# Patient Record
Sex: Male | Born: 1955 | Race: White | Hispanic: No | Marital: Married | Smoking: Never smoker
Health system: Southern US, Community
[De-identification: ages and names within clinical notes are randomized; demographics above are authoritative.]

## PROBLEM LIST (undated history)

## (undated) DIAGNOSIS — D72819 Decreased white blood cell count, unspecified: Secondary | ICD-10-CM

## (undated) DIAGNOSIS — J45909 Unspecified asthma, uncomplicated: Secondary | ICD-10-CM

## (undated) DIAGNOSIS — L719 Rosacea, unspecified: Secondary | ICD-10-CM

## (undated) DIAGNOSIS — G8929 Other chronic pain: Secondary | ICD-10-CM

## (undated) DIAGNOSIS — M542 Cervicalgia: Secondary | ICD-10-CM

## (undated) DIAGNOSIS — G43909 Migraine, unspecified, not intractable, without status migrainosus: Secondary | ICD-10-CM

## (undated) DIAGNOSIS — K219 Gastro-esophageal reflux disease without esophagitis: Secondary | ICD-10-CM

## (undated) DIAGNOSIS — R634 Abnormal weight loss: Secondary | ICD-10-CM

## (undated) HISTORY — DX: Abnormal weight loss: R63.4

## (undated) HISTORY — DX: Gastro-esophageal reflux disease without esophagitis: K21.9

## (undated) HISTORY — DX: Rosacea, unspecified: L71.9

## (undated) HISTORY — DX: Other chronic pain: G89.29

## (undated) HISTORY — DX: Migraine, unspecified, not intractable, without status migrainosus: G43.909

## (undated) HISTORY — DX: Cervicalgia: M54.2

## (undated) HISTORY — DX: Unspecified asthma, uncomplicated: J45.909

## (undated) HISTORY — DX: Decreased white blood cell count, unspecified: D72.819

---

## 2009-05-05 ENCOUNTER — Encounter: Admission: RE | Admit: 2009-05-05 | Discharge: 2009-05-05 | Payer: Self-pay | Admitting: Family Medicine

## 2009-11-18 ENCOUNTER — Encounter: Admission: RE | Admit: 2009-11-18 | Discharge: 2009-11-18 | Payer: Self-pay | Admitting: Family Medicine

## 2010-10-01 ENCOUNTER — Encounter: Admission: RE | Admit: 2010-10-01 | Discharge: 2010-10-01 | Payer: Self-pay | Admitting: Family Medicine

## 2011-03-26 ENCOUNTER — Encounter: Payer: Self-pay | Admitting: Sports Medicine

## 2011-03-26 ENCOUNTER — Ambulatory Visit (INDEPENDENT_AMBULATORY_CARE_PROVIDER_SITE_OTHER): Payer: Managed Care, Other (non HMO) | Admitting: Sports Medicine

## 2011-03-26 VITALS — BP 111/74 | HR 70 | Ht 69.0 in | Wt 161.0 lb

## 2011-03-26 DIAGNOSIS — IMO0002 Reserved for concepts with insufficient information to code with codable children: Secondary | ICD-10-CM

## 2011-03-26 DIAGNOSIS — M25562 Pain in left knee: Secondary | ICD-10-CM

## 2011-03-26 DIAGNOSIS — S83249A Other tear of medial meniscus, current injury, unspecified knee, initial encounter: Secondary | ICD-10-CM

## 2011-03-26 DIAGNOSIS — M25569 Pain in unspecified knee: Secondary | ICD-10-CM

## 2011-03-26 NOTE — Assessment & Plan Note (Signed)
He should use ice and symptomatic medications as needed. A series of rehabilitation exercises that do not allow him to much knee flexion. Start training on a bicycle to improve quad strength on the left. Further knee extensions and a ball between his knees to get the VMO active  Recheck in 6-8 weeks. Avoid water skiing during this time.

## 2011-03-26 NOTE — Progress Notes (Signed)
  Subjective:    Patient ID: Connor Lee, male    DOB: Nov 19, 1956, 55 y.o.   MRN: [id number]  HPI  Pt presents to clinic for eval of lt knee pain that has been worsening x 1 year.  Pain initially started in his late 37s, and was evaluated at that time, and had improved for many years.  Has started becoming more active in Feb since retiring.  Walking on treadmill 4-5 per week.  Knee feels tight with walking initially, then feels better as the walk progresses.  Also feels some medial knee pain with walking.  When walking downhill feels like knee could give away, does not have locking.  Takes advil occasionally which is helpful.    Review of Systems     Objective:   Physical Exam    Atrophy of L VMO L knee lacks 3 deg of full extension with straight leg raise R knee full extension, and VMO/quad strength is good  L full flexion with clicking on medial joint line, full lateral flexion Medial rotation w flexion causes pain at joint line Lockman neg MCL/LCL stable Abduction strength good on lt Full extension in lying position of lt knee when relaxed   MSK ultrasound There are a couple small spurs at the superior patella at the attachment of the quadriceps tendon. There is no suprapatellar pouch effusion and no tears in the tendon itself. Patellar tendon is intact with the exception of the lateral margin which has a small hypoechoic area seen on both the long and transverse views. There was no abnormal Doppler activity over this area. Lateral meniscus is normal. Medial meniscus shows a hypoechoic area with a separated fragment and some calcification in the main meniscal body along the medial joint line at the point of maximal tenderness.   Assessment & Plan:

## 2011-03-26 NOTE — Assessment & Plan Note (Signed)
He is a compression sleeve for the left knee which did make him feel a bit more stable when he is going down hills or going to need to much. Avoid any deep collection or anything that causes more than mild pain of the left medial knee.  If he is not successful with conservative healing we will refer him for further evaluation.

## 2011-03-26 NOTE — Progress Notes (Deleted)
  Subjective:    Patient ID: Connor Lee, male    DOB: [birth year], 55 y.o.   MRN: [id number]  HPI    Review of Systems     Objective:   Physical Exam        Assessment & Plan:

## 2011-04-15 ENCOUNTER — Ambulatory Visit: Payer: Self-pay | Admitting: Sports Medicine

## 2011-04-29 ENCOUNTER — Encounter: Payer: Self-pay | Admitting: Sports Medicine

## 2011-04-29 ENCOUNTER — Ambulatory Visit (INDEPENDENT_AMBULATORY_CARE_PROVIDER_SITE_OTHER): Payer: Managed Care, Other (non HMO) | Admitting: Sports Medicine

## 2011-04-29 VITALS — BP 110/74 | HR 75 | Ht 69.0 in | Wt 160.0 lb

## 2011-04-29 DIAGNOSIS — M25562 Pain in left knee: Secondary | ICD-10-CM

## 2011-04-29 DIAGNOSIS — M25569 Pain in unspecified knee: Secondary | ICD-10-CM

## 2011-04-29 DIAGNOSIS — S83249A Other tear of medial meniscus, current injury, unspecified knee, initial encounter: Secondary | ICD-10-CM

## 2011-04-29 DIAGNOSIS — IMO0002 Reserved for concepts with insufficient information to code with codable children: Secondary | ICD-10-CM

## 2011-04-29 NOTE — Assessment & Plan Note (Signed)
Much improved with concservative tx.  Recommended continued HEP, wear knee sleeve for another month.  Advised may try skiing in 1 month.  Follow-up prn

## 2011-04-29 NOTE — Assessment & Plan Note (Signed)
Pain has pretty much resolved so he can use when necessary medications. I would continue to use the knee compression sleeve on his left knee for activity.

## 2011-04-29 NOTE — Progress Notes (Signed)
  Subjective:    Patient ID: Les Pou, male    DOB: Jun 13, 1956, 55 y.o.   MRN: [id number]  HPI here for 6 week recheck of left knee pain due to meniscal tear  80% improved No longer giving out during walks Using body helix Eager to get back to skiing No swelling, pain, need for medications.   Review of Systems     Objective:   Physical Exam Left knee: Improvement in left VMO atrophy, good quad strength Able to fully extend knee, fully flex Minimal pain on palpation of medial joint line No clicking Lachmann negative Normal bounce test Normal Thessaly test  Objective:          Assessment & Plan:

## 2011-05-04 ENCOUNTER — Ambulatory Visit: Payer: Managed Care, Other (non HMO) | Admitting: Family Medicine

## 2011-08-18 ENCOUNTER — Other Ambulatory Visit: Payer: Self-pay | Admitting: Family Medicine

## 2011-08-18 ENCOUNTER — Ambulatory Visit
Admission: RE | Admit: 2011-08-18 | Discharge: 2011-08-18 | Disposition: A | Discharge status: Home / Self Care | Payer: Managed Care, Other (non HMO) | Source: Ambulatory Visit | Attending: Family Medicine | Admitting: Family Medicine

## 2011-08-18 DIAGNOSIS — R634 Abnormal weight loss: Secondary | ICD-10-CM

## 2011-08-18 MED ORDER — IOHEXOL 300 MG/ML  SOLN
100.0000 mL | Freq: Once | INTRAMUSCULAR | Status: AC | PRN
  Administered 2011-08-18: 100 mL via INTRAVENOUS

## 2011-08-30 ENCOUNTER — Ambulatory Visit (HOSPITAL_COMMUNITY)
Admission: RE | Admit: 2011-08-30 | Discharge: 2011-08-30 | Disposition: A | Discharge status: Home / Self Care | Payer: Managed Care, Other (non HMO) | Source: Ambulatory Visit | Attending: Urology | Admitting: Urology

## 2011-08-30 DIAGNOSIS — Z79899 Other long term (current) drug therapy: Secondary | ICD-10-CM | POA: Insufficient documentation

## 2011-08-30 DIAGNOSIS — J45909 Unspecified asthma, uncomplicated: Secondary | ICD-10-CM | POA: Insufficient documentation

## 2011-08-30 DIAGNOSIS — N201 Calculus of ureter: Secondary | ICD-10-CM | POA: Insufficient documentation

## 2011-08-30 DIAGNOSIS — E7800 Pure hypercholesterolemia, unspecified: Secondary | ICD-10-CM | POA: Insufficient documentation

## 2011-08-30 DIAGNOSIS — K219 Gastro-esophageal reflux disease without esophagitis: Secondary | ICD-10-CM | POA: Insufficient documentation

## 2011-08-30 DIAGNOSIS — N1330 Unspecified hydronephrosis: Secondary | ICD-10-CM | POA: Insufficient documentation

## 2011-08-30 DIAGNOSIS — G43909 Migraine, unspecified, not intractable, without status migrainosus: Secondary | ICD-10-CM | POA: Insufficient documentation

## 2011-08-30 DIAGNOSIS — IMO0002 Reserved for concepts with insufficient information to code with codable children: Secondary | ICD-10-CM | POA: Insufficient documentation

## 2011-11-15 ENCOUNTER — Encounter (HOSPITAL_BASED_OUTPATIENT_CLINIC_OR_DEPARTMENT_OTHER): Admission: RE | Payer: Self-pay | Source: Ambulatory Visit

## 2011-11-15 ENCOUNTER — Ambulatory Visit (HOSPITAL_BASED_OUTPATIENT_CLINIC_OR_DEPARTMENT_OTHER): Admission: RE | Admit: 2011-11-15 | Payer: Managed Care, Other (non HMO) | Source: Ambulatory Visit | Admitting: Urology

## 2011-11-15 SURGERY — CYSTOSCOPY, WITH RETROGRADE PYELOGRAM AND URETERAL STENT INSERTION
Anesthesia: General | Laterality: Left

## 2012-12-26 ENCOUNTER — Ambulatory Visit (INDEPENDENT_AMBULATORY_CARE_PROVIDER_SITE_OTHER): Payer: BC Managed Care – PPO | Admitting: Sports Medicine

## 2012-12-26 ENCOUNTER — Encounter: Payer: Self-pay | Admitting: Sports Medicine

## 2012-12-26 VITALS — BP 118/74 | HR 75 | Ht 69.0 in | Wt 165.0 lb

## 2012-12-26 DIAGNOSIS — S83249A Other tear of medial meniscus, current injury, unspecified knee, initial encounter: Secondary | ICD-10-CM

## 2012-12-26 DIAGNOSIS — M533 Sacrococcygeal disorders, not elsewhere classified: Secondary | ICD-10-CM

## 2012-12-26 DIAGNOSIS — IMO0002 Reserved for concepts with insufficient information to code with codable children: Secondary | ICD-10-CM

## 2012-12-26 NOTE — Assessment & Plan Note (Signed)
I think it foot pain may actually relate to some radiation from the low back area  We will give him a home exercise program as noted in patient instructions  He will work on the areas of muscular tenderness and will return to see [place] as needed

## 2012-12-26 NOTE — Progress Notes (Signed)
  Subjective:    Patient ID: Connor Lee, male    DOB: 09-Jan-1956, 57 y.o.   MRN: [id number]  HPI  Pt presents to clinic for f/u of lt knee pain, left forefoot pain. Lt medial knee is doing well, only has occasional clicking.  Continues to do home exercises for knee. Walks on treadmill daily.  Has noticed cramping in lt forefoot with walking. Does not cause limping.  Loosens up as walk progresses.  Lower back stiffness during and after exercise. Also notices rt side lower back discomfort at times with sitting.  Review of Systems     Objective:   Physical Exam   NAD  No pain with toe, heel, or lateral foot walking  Moderately high arch Broad forefoot Small bunionettes No tenderness over metatarsals  Lt forefoot- not ttp  Leg lengths equal  Straight leg raise neg bilat, tighter on rt FABER normal bilat SI joints move freely  30 degree back extension with no problem SI joint nodule on rt He is somewhat tight to pretzel type stretch  LT Knee: Normal to inspection with no erythema or effusion or obvious bony abnormalities. Palpation normal with no warmth or joint line tenderness or patellar tenderness or condyle tenderness. ROM normal in flexion and extension and lower leg rotation. Ligaments with solid consistent endpoints including ACL, PCL, LCL, MCL. Negative Mcmurray's and provocative meniscal tests. Non painful patellar compression. Patellar and quadriceps tendons unremarkable. Hamstring and quadriceps strength is normal.           Assessment & Plan:

## 2012-12-26 NOTE — Patient Instructions (Addendum)
Do cobra stretch - 3 times for 5 seconds  Knee to opposite shoulder - 5 times for 5 seconds - each side  Both knees to chest - roll back and forth- 3 times for 5 seconds each  Superman stretch 3 times for 5 seconds each  Standing hip rotations  For forearm- Do bicep curls and wrist rolls   Please follow up as needed  Thank you for seeing [place] today!

## 2012-12-26 NOTE — Assessment & Plan Note (Signed)
I think this has healed and he can continue any exercises that do not create knee pain  Keep up some of his home exercise program 2-3 times weekly  Sports and soles look good and I think he should continue using those

## 2013-04-17 ENCOUNTER — Encounter: Payer: Self-pay | Admitting: Sports Medicine

## 2013-04-17 ENCOUNTER — Ambulatory Visit (INDEPENDENT_AMBULATORY_CARE_PROVIDER_SITE_OTHER): Payer: BC Managed Care – PPO | Admitting: Sports Medicine

## 2013-04-17 VITALS — BP 122/78 | HR 74 | Ht 69.0 in | Wt 165.0 lb

## 2013-04-17 DIAGNOSIS — M533 Sacrococcygeal disorders, not elsewhere classified: Secondary | ICD-10-CM

## 2013-04-17 NOTE — Progress Notes (Signed)
  Subjective:    Patient ID: Connor Lee, male    DOB: [birth year], 57 y.o.   MRN: [id number]  HPI  1. Low back pain. Has right side low back pain present for one year. 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 The prescribed home exercises seem to make pain worse initially. He also notes about one hour of stiffness in the mornings. Cyst feels essentially unchanged. He previously played golf many years, but recently is doing walking and likes to water ski.   He denies any recent falls, leg pain, weight loss, chills, sweats, numbness, weakness.   Review of Systems See HPI otherwise negative.  reports that he has never smoked. He has never used smokeless tobacco.     Objective:   Physical Exam  Constitutional: He appears well-developed and well-nourished. No distress.  Musculoskeletal:  Bilateral hip ROM intact, 45 deg int rotation, 35 deg ext rotation. TTP at right SI joint. Cyst is palpable at right SI~ 1.5 cm, mobile, smooth. AT left SI a smaller about 0.5 cm non-tender nodule palpable.  Negative straight leg testing and cross leg testing.   Lumbar ROM intact.    Skin: He is not diaphoretic.   MSK [place]: right SI joint has overlying iso-oechoic nodule 1.4 x 1.6 cm in diameter. This appears to be encased in a split of fascia.      Assessment & Plan:

## 2013-04-17 NOTE — Assessment & Plan Note (Addendum)
Continues with pain and stiffness, failed conservative management.   Written consent obtained and risk/benefits discussed, skin sterilized. The nodule is injected with steroid 0.4 cc kenolog with 0.4 cc lidocaine using a TB needle. This is done under direct [place] visualization  Discussed with patient to continue activities as tolerated and follow up prn. This may require more than 1 injection to shrink scar tissue.

## 2013-04-17 NOTE — Patient Instructions (Signed)
Follow up prn. Continue activities as tolerated.

## 2013-05-29 ENCOUNTER — Ambulatory Visit (INDEPENDENT_AMBULATORY_CARE_PROVIDER_SITE_OTHER): Payer: BC Managed Care – PPO | Admitting: Sports Medicine

## 2013-05-29 VITALS — BP 110/70 | Ht 69.0 in | Wt 165.0 lb

## 2013-05-29 DIAGNOSIS — M533 Sacrococcygeal disorders, not elsewhere classified: Secondary | ICD-10-CM

## 2013-05-29 DIAGNOSIS — R229 Localized swelling, mass and lump, unspecified: Secondary | ICD-10-CM

## 2013-05-29 NOTE — Assessment & Plan Note (Signed)
Excellent improvement in sxs and full activity is OK  Keep up stretches and exercises

## 2013-05-29 NOTE — Progress Notes (Signed)
[facility] [address] [place], [place] [address] Phone: [phone] Fax: [phone]   Patient Name: Connor Lee Date of Birth: 01-Dec-1955 Medical Record Number: [id number] Gender: male Date of Encounter: 05/29/2013  History of Present Illness:  Connor Lee is a 57 y.o. very pleasant male patient who presents with the following:  57 y/o CM with PMH of SIJ dysfunction returning to clinic for follow up. Has had better ROM and pain from Rt SIJ since receiving steroid injection into nodule. Still having some pain, especially with rotation and while playing golf. Overall, feeling better today. No recent injuries. Still using exercises. Denies fevers, chills, nausea, myalgias, arthralgias, rash.  Patient Active Problem List   Diagnosis Date Noted  . 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 Left knee pain 03/26/2011  . Tear of medial meniscus of knee 03/26/2011   No past medical history on file. No past surgical history on file. History  Substance Use Topics  . Smoking status: Never Smoker   . Smokeless tobacco: Never Used  . Alcohol Use: Not on file   No family history on file. No Known Allergies  Medication list has been reviewed and updated.  Prior to Admission medications   Medication Sig Start Date End Date Taking? Authorizing Provider  Albuterol (PROVENTIL IN) Inhale into the lungs as needed.      Historical Provider, MD  ciclesonide (ALVESCO) 160 MCG/ACT inhaler Inhale 1-2 puffs into the lungs 2 (two) times daily.      Historical Provider, MD  doxycycline (VIBRAMYCIN) 100 MG capsule Take 100 mg by mouth daily before supper.      Historical Provider, MD  lansoprazole (PREVACID) 30 MG capsule Take 15 mg by mouth daily.     Historical Provider, MD  Loratadine (CLARITIN PO) Take by mouth every morning.      Historical Provider, MD  metroNIDAZOLE (METROGEL) 0.75 % gel Apply 1 application topically 2 (two) times daily.       Historical Provider, MD  montelukast (SINGULAIR) 10 MG tablet Take 10 mg by mouth every morning.      Historical Provider, MD  penciclovir (DENAVIR) 1 % cream Apply 1 application topically as needed.      Historical Provider, MD  Vitamin D, Ergocalciferol, (DRISDOL) 50000 UNITS CAPS Take 50,000 Units by mouth every 7 (seven) days.      Historical Provider, MD    Review of Systems:  Denies any fever, chills or systemic sxs;  No generalized OA sxs  Physical Examination: Filed Vitals:   05/29/13 0859  BP: 110/70   Filed Vitals:   05/29/13 0859  Height: 5\' 9"  (1.753 m)  Weight: 165 lb (74.844 kg)   Body mass index is 24.36 kg/(m^2).  GEN: NAD. Pt is pleasant and cooperative with exam. MSK: FROM in flexion/extension, rotation and side bending. +Pain with Rt side loading in extension. FABER neg, FADIR neg. No pain in rocking SIJs. Pretzel stretch neg. Straight leg neg. Skin: soft, rubbery, easily movable nodule palpated superficial to Rt SIJ. No warmth, erythema, edema, fluctuance noted.   MSK [place] Nodule is visualized in same planes Compared to last [place] nodule was 1.6 x 1.4 cms Today nodule is 1.34 x0.28 cms with no fluid noted around soft tissue  Assessment and Plan: Sacroiliac joint dysfunction  Subcutaneous nodule  1. SIJ dysfunction - Most likely 2/2 nodule in area of SIJ. Continue with ice, NSAIDs and exercises.   2. Nodule - u/s performed in office showed about  75% decrease in size today s/p steroid injection. Will continue to monitor today. Consider steroid injection agree after 3 months has past since 1st injection.  Enid Baas, MD

## 2013-05-29 NOTE — Assessment & Plan Note (Signed)
Consider repeat CSI if this becomes painful again

## 2013-06-05 ENCOUNTER — Ambulatory Visit: Payer: BC Managed Care – PPO | Admitting: Sports Medicine

## 2013-07-25 ENCOUNTER — Ambulatory Visit (INDEPENDENT_AMBULATORY_CARE_PROVIDER_SITE_OTHER): Payer: BC Managed Care – PPO | Admitting: Sports Medicine

## 2013-07-25 VITALS — BP 108/60 | Ht 69.0 in | Wt 165.0 lb

## 2013-07-25 DIAGNOSIS — M533 Sacrococcygeal disorders, not elsewhere classified: Secondary | ICD-10-CM

## 2013-07-25 NOTE — Progress Notes (Signed)
  Subjective:    Patient ID: Connor Lee, male    DOB: May 17, 1956, 57 y.o.   MRN: [id number]  HPI Patient comes in today with returning right-sided low back pain. He is a well-established patient of Dr. Darrick Penna. He has most recently been treated for SI joint dysfunction. Back in May a painful nodule over the right SI joint was injected with cortisone. This resulted in good symptom relief and some shrinkage of the cyst on ultrasound. He was doing well until yesterday when he began to develop some returning stiffness and this morning had severe pain upon awakening. His pain is identical in nature to what he's experienced previously. He localizes it directly over the right SI joint. He has been doing a couple of SI joint exercises. He is not taking much in the way of over-the-counter anti-inflammatories. He denies any radiating pain down the leg. No numbness or tingling.  Interim medical history is unchanged from his last office visit    Review of Systems     Objective:   Physical Exam Well-developed, well-nourished. No acute distress. Awake alert and oriented x3  Lumbar spine: Good lumbar flexion and extension. There is tenderness to palpation over the right SI joint with a positive FABER. Over top of the SI joint just underneath the skin there is a freely mobile nodule. No erythema. It is fluctuant and uncomfortable to palpation. There is a small scar from a previous surgery in this area which is consistent with prior lipoma removal. Mild atrophy and hypopigmentation here as well. Negative straight leg raise. Walking without a limp  MSK ultrasound: Nodule was well visualized and measures 1.3 x 0.33 cm. This is almost identical to the measurements obtained in July on ultrasound.       Assessment & Plan:  1. 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 Patient tolerated this without  difficulty after risks and benefits were explained. He is given a comprehensive SI joint home exercise program and will return to the office in 4 weeks. If symptoms persist we may want to consider merits of an injection into the SI joint. We'll plan on repeating his ultrasound at his followup. Call with questions or concerns in the interim.

## 2013-08-22 ENCOUNTER — Encounter: Payer: Self-pay | Admitting: Sports Medicine

## 2013-08-22 ENCOUNTER — Ambulatory Visit (INDEPENDENT_AMBULATORY_CARE_PROVIDER_SITE_OTHER): Payer: BC Managed Care – PPO | Admitting: Sports Medicine

## 2013-08-22 VITALS — BP 119/80 | HR 89 | Ht 69.0 in | Wt 169.0 lb

## 2013-08-22 DIAGNOSIS — M533 Sacrococcygeal disorders, not elsewhere classified: Secondary | ICD-10-CM

## 2013-08-22 NOTE — Patient Instructions (Signed)
Thank you for coming in today  Continue home exercises and stretches  Followup as needed  Let us know if you have any return of concussion symptoms and we would be happy to help you
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
## 2014-10-28 ENCOUNTER — Encounter: Payer: Self-pay | Admitting: Pulmonary Disease

## 2014-10-29 ENCOUNTER — Other Ambulatory Visit: Payer: BC Managed Care – PPO

## 2014-10-29 ENCOUNTER — Encounter: Payer: Self-pay | Admitting: Pulmonary Disease

## 2014-10-29 ENCOUNTER — Ambulatory Visit (INDEPENDENT_AMBULATORY_CARE_PROVIDER_SITE_OTHER): Payer: BC Managed Care – PPO | Admitting: Pulmonary Disease

## 2014-10-29 ENCOUNTER — Ambulatory Visit (INDEPENDENT_AMBULATORY_CARE_PROVIDER_SITE_OTHER)
Admission: RE | Admit: 2014-10-29 | Discharge: 2014-10-29 | Disposition: A | Discharge status: Home / Self Care | Payer: BC Managed Care – PPO | Source: Ambulatory Visit | Attending: Pulmonary Disease | Admitting: Pulmonary Disease

## 2014-10-29 ENCOUNTER — Encounter (INDEPENDENT_AMBULATORY_CARE_PROVIDER_SITE_OTHER): Payer: Self-pay

## 2014-10-29 VITALS — BP 102/68 | HR 77 | Ht 69.0 in | Wt 176.0 lb

## 2014-10-29 DIAGNOSIS — J4540 Moderate persistent asthma, uncomplicated: Secondary | ICD-10-CM | POA: Insufficient documentation

## 2014-10-29 DIAGNOSIS — R0600 Dyspnea, unspecified: Secondary | ICD-10-CM | POA: Insufficient documentation

## 2014-10-29 DIAGNOSIS — R0602 Shortness of breath: Secondary | ICD-10-CM

## 2014-10-29 NOTE — Assessment & Plan Note (Signed)
Feel that the most likely cause of his shortness of breath is the asthma as explained above. If he does not have improvement with the increased dose of the inhaled corticosteroid then we could consider evaluation for cardiac problems with an echocardiogram and a stress test.

## 2014-10-29 NOTE — Progress Notes (Signed)
Subjective:    Patient ID: Connor Lee, male    DOB: 06/22/1956, 58 y.o.   MRN: [id number]  HPI Connor Lee was sent by his primary care physician Dr. Doristine CounterBurnett for evaluation of his asthma and allergies. He says that he travled this year at the end of September and he visited his family and he thinks that he caught bronchitis from someone on the trip.  He says that he started feeling poorly and he started using his rescue inhaler and by October 5 he started on prednisone 10mg  bid for a few days.  This didn't help so he took as much as 30mg  fo a few days.  This didn't help so he went to his primary care physician. At that point he was coughing up yellow to red mucus and he started taking Levaquin, finished a course 10/21.  He started to feel a little better.  He noticed however that his breathing wasn't back to normal and he had to cut back on the duration and pace of his exercise.  He tried using his albuterol just before exercise which hleped a little.  However he says that this has not brought him back to where hewated to be and he still has dyspnea with exercise.  During this time he has noticed that he continues to bring up some mucus in the mornings.  Otherwise he doesn't have problems with cough.    Some hoarseness lately.    Leading up until this he had not really had much problems in 2015.  He said that he works out regularly adn this has been good for the majority of this year.  He keeps prednisone at home for emergency situation and he says that the worst years he has to use this three times, but for the most part in the last 3 years he only had to use it once per year (or even less frequent than that).    He notes having dry needling donw for physician therapy recently.  They typically stick a needle in his neck with this.  He has never smoked.  He had ashtma as a kid including immunotherapy and missed a lot of school as a kid.  In recent years he has not had too much trouble (see above).   His sinus symptoms have been better lately.  He says that cigarette smokes, heavy air, nighttime, dogs, cats, dust, fumes typically make his symptoms worse.  He has not had to use his inhaler much lately aside from exercise.      Past Medical History  Diagnosis Date  . GERD (gastroesophageal reflux disease)   . Weight loss   . Asthma   . Chronic neck pain   . Leukopenia   . Migraine headache   . Rosacea      Family History  Problem Relation Age of Onset  . Asthma Daughter      History   Social History  . Marital Status: Married    Spouse Name: N/A    Number of Children: N/A  . Years of Education: N/A   Occupational History  . Not on file.   Social History Main Topics  . Smoking status: Never Smoker   . Smokeless tobacco: Never Used  . Alcohol Use: 0.0 oz/week    0 Not specified per week     Comment: social  . Drug Use: No  . Sexual Activity: Not on file   Other Topics Concern  . Not on file  Social History Narrative     Allergies  Allergen Reactions  . Penicillins      Outpatient Prescriptions Prior to Visit  Medication Sig Dispense Refill  . ciclesonide (ALVESCO) 160 MCG/ACT inhaler Inhale 2 puffs into the lungs 2 (two) times daily.     Marland Kitchen doxycycline (VIBRAMYCIN) 100 MG capsule Take 100 mg by mouth 2 (two) times daily.     . lansoprazole (PREVACID) 30 MG capsule Take 15 mg by mouth daily.     . Loratadine (CLARITIN PO) Take by mouth every morning.      . metroNIDAZOLE (METROGEL) 0.75 % gel Apply 1 application topically 2 (two) times daily as needed.     . montelukast (SINGULAIR) 10 MG tablet Take 10 mg by mouth every morning.      . penciclovir (DENAVIR) 1 % cream Apply 1 application topically as needed.      . Vitamin D, Ergocalciferol, (DRISDOL) 50000 UNITS CAPS Take 50,000 Units by mouth every 7 (seven) days.      . Albuterol (PROVENTIL IN) Inhale into the lungs as needed.      . cyclobenzaprine (FLEXERIL) 10 MG tablet Take 10 mg by mouth at bedtime  as needed for muscle spasms.     No facility-administered medications prior to visit.      Review of Systems  Constitutional: Positive for unexpected weight change. Negative for fever, chills, diaphoresis, activity change, appetite change and fatigue.  HENT: Negative for congestion, dental problem, ear discharge, ear pain, facial swelling, hearing loss, mouth sores, nosebleeds, postnasal drip, rhinorrhea, sinus pressure, sneezing, sore throat, tinnitus, trouble swallowing and voice change.   Eyes: Negative for photophobia, discharge, itching and visual disturbance.  Respiratory: Positive for cough and shortness of breath. Negative for apnea, choking, chest tightness, wheezing and stridor.   Cardiovascular: Negative for chest pain, palpitations and leg swelling.  Gastrointestinal: Negative for nausea, vomiting, abdominal pain, constipation, blood in stool and abdominal distention.  Genitourinary: Negative for dysuria, urgency, frequency, hematuria, flank pain, decreased urine volume and difficulty urinating.  Musculoskeletal: Negative for myalgias, back pain, joint swelling, arthralgias, gait problem, neck pain and neck stiffness.  Skin: Negative for color change, pallor and rash.  Neurological: Negative for dizziness, tremors, seizures, syncope, speech difficulty, weakness, light-headedness, numbness and headaches.  Hematological: Negative for adenopathy. Does not bruise/bleed easily.  Psychiatric/Behavioral: Negative for confusion, sleep disturbance and agitation. The patient is not nervous/anxious.        Objective:   Physical Exam Filed Vitals:   10/29/14 1535 10/29/14 1536  BP:  102/68  Pulse:  77  Height: 5\' 9"  (1.753 m)   Weight: 176 lb (79.833 kg)   SpO2:  98%   Today he ambulated 500 feet on room air and his O2 saturation remained above 95%.  Gen: well appearing, no acute distress HEENT: NCAT, PERRL, EOMi, OP clear, neck supple without masses PULM: very mild exp wheeze,  good air movement CV: RRR, no mgr, no JVD AB: BS+, soft, nontender, no hsm Ext: warm, no edema, no clubbing, no cyanosis Derm: no rash or skin breakdown Neuro: A&Ox4, CN II-XII intact, strength 5/5 in all 4 extremities  I have reviewed his primary care doctor's records      Assessment & Plan:   Moderate persistent asthma  I explained to Mr. Caniglia that it sounds like he had a nasty viral infection leading to bronchitis which is caused prolonged respiratory symptoms directly related to his lifelong asthma. Today he had very mild wheezing on physical  exam. There has been no changes in his environment recently to suggest that there is a new environmental cause. After severe viral infections folks can have persistent symptoms. Today we will check a chest x-ray as well as pulmonary function testing and some blood work to ensure that there is no other possible pulmonary or environmental cause which is causing his ongoing symptoms.  Plan: -Check full pulmonary function testing -Check serum IgE and eosinophil  -check chest x-ray -Increase controller steroid to 3 puffs twice a day for 2 weeks, then back off to baseline dose -Use albuterol every 6 hours for the next 1-2 weeks   Dyspnea Feel that the most likely cause of his shortness of breath is the asthma as explained above. If he does not have improvement with the increased dose of the inhaled corticosteroid then we could consider evaluation for cardiac problems with an echocardiogram and a stress test.    Updated Medication List Outpatient Encounter Prescriptions as of 10/29/2014  Medication Sig  . ciclesonide (ALVESCO) 160 MCG/ACT inhaler Inhale 2 puffs into the lungs 2 (two) times daily.   Marland Kitchen. doxycycline (VIBRAMYCIN) 100 MG capsule Take 100 mg by mouth 2 (two) times daily.   . lansoprazole (PREVACID) 30 MG capsule Take 15 mg by mouth daily.   . Loratadine (CLARITIN PO) Take by mouth every morning.    . metroNIDAZOLE (METROGEL) 0.75 %  gel Apply 1 application topically 2 (two) times daily as needed.   . montelukast (SINGULAIR) 10 MG tablet Take 10 mg by mouth every morning.    . penciclovir (DENAVIR) 1 % cream Apply 1 application topically as needed.    Marland Kitchen. PROAIR HFA 108 (90 BASE) MCG/ACT inhaler Inhale 2 puffs into the lungs as needed.  . Vitamin D, Ergocalciferol, (DRISDOL) 50000 UNITS CAPS Take 50,000 Units by mouth every 7 (seven) days.    . [DISCONTINUED] Albuterol (PROVENTIL IN) Inhale into the lungs as needed.    . [DISCONTINUED] cyclobenzaprine (FLEXERIL) 10 MG tablet Take 10 mg by mouth at bedtime as needed for muscle spasms.

## 2014-10-29 NOTE — Patient Instructions (Signed)
Take the alvesco 3 puffs twice a day for a few weeks Take albuterol 2 puffs every 6 hours regularly for a few weeks We will call you with the results of today's blood work and chest x-ray We will see you back in January 2016

## 2014-10-29 NOTE — Assessment & Plan Note (Signed)
I explained to Connor Lee that it sounds like he had a nasty viral infection leading to bronchitis which is caused prolonged respiratory symptoms directly related to his lifelong asthma. Today he had very mild wheezing on physical exam. There has been no changes in his environment recently to suggest that there is a new environmental cause. After severe viral infections folks can have persistent symptoms. Today we will check a chest x-ray as well as pulmonary function testing and some blood work to ensure that there is no other possible pulmonary or environmental cause which is causing his ongoing symptoms.  Plan: -Check full pulmonary function testing -Check serum IgE and eosinophil  -check chest x-ray -Increase controller steroid to 3 puffs twice a day for 2 weeks, then back off to baseline dose -Use albuterol every 6 hours for the next 1-2 weeks

## 2014-10-30 LAB — IGE: IgE (Immunoglobulin E), Serum: 9 kU/L (ref <115)

## 2014-10-30 NOTE — Progress Notes (Signed)
Quick Note:  Pt aware of results. ______ 

## 2014-10-31 NOTE — Progress Notes (Signed)
Quick Note:  Pt aware of results. ______ 

## 2014-11-26 ENCOUNTER — Telehealth: Payer: Self-pay | Admitting: Pulmonary Disease

## 2014-11-26 NOTE — Telephone Encounter (Signed)
Called and spoke with pt and he is aware of lab that will be printed out and mailed to his home address that has been verified.  Pt stated that he is feeling better.  He is aware that the cbcd was not drawn.  Nothing further is needed.

## 2014-12-05 ENCOUNTER — Encounter: Payer: Self-pay | Admitting: Pulmonary Disease

## 2014-12-05 ENCOUNTER — Ambulatory Visit (INDEPENDENT_AMBULATORY_CARE_PROVIDER_SITE_OTHER): Payer: BLUE CROSS/BLUE SHIELD | Admitting: Pulmonary Disease

## 2014-12-05 ENCOUNTER — Encounter (INDEPENDENT_AMBULATORY_CARE_PROVIDER_SITE_OTHER): Payer: Self-pay

## 2014-12-05 VITALS — BP 130/68 | HR 89 | Ht 69.0 in | Wt 172.0 lb

## 2014-12-05 DIAGNOSIS — J4540 Moderate persistent asthma, uncomplicated: Secondary | ICD-10-CM

## 2014-12-05 NOTE — Patient Instructions (Signed)
Keep taking the Alvesco at 1 puff twice a day, continue Singulair As we discussed, you could consider stopping for a few months in the future if you are doing well, with plans to restart before the fall We will see you back as needed

## 2014-12-05 NOTE — Assessment & Plan Note (Signed)
This has been a stable interval for Connor Lee with his moderate persistent asthma. He has been able to titrate his Alvesco dose back down to 2 puffs twice a day. I have advised that he decrease it to 1 puff twice a day and continue at that dose for the remainder of the year. If he has a year of stability then he may start to consider stopping the inhaled corticosteroid during seasons when his shortness of breath and wheezing are not a problem.  I have also advised that he continue to use albuterol on an as-needed basis before exercise to prevent exercise-induced bronchospasm.  He should continue using Singulair.  He should continue to get an annual flu shot.

## 2014-12-05 NOTE — Progress Notes (Signed)
   Subjective:    Patient ID: Connor Lee, male    DOB: [birth year], 59 y.o.   MRN: [id number]  Synopsis: Moderate persistent asthma for most of his life, referred and late 2015 after a mild exacerbation was treated as an outpatient.  HPI  Chief Complaint  Patient presents with  . Follow-up    pt states using albuterol before exercize helps with sob, chest tightness.  fatigue has resolved.     Connor Lee is doing better now.  He says that the fatigue is gone and his exercise tolerance is much better.  He tried using the labuterol before and that helps.  No chest tightness, no wheezing, no shortness of breath.    Past Medical History  Diagnosis Date  . GERD (gastroesophageal reflux disease)   . Weight loss   . Asthma   . Chronic neck pain   . Leukopenia   . Migraine headache   . Rosacea      Review of Systems     Objective:   Physical Exam  Filed Vitals:   12/05/14 1551  BP: 130/68  Pulse: 89  Height: 5\' 9"  (1.753 m)  Weight: 172 lb (78.019 kg)  SpO2: 98%  RA  Gen: well appearing, no acute distress HEENT: NCAT, EOMi, OP clear, PULM: CTA B CV: RRR, no mgr, no JVD AB: BS+, soft, nontender,  Ext: warm, no edema, no clubbing, no cyanosis Derm: no rash or skin breakdown Neuro: A&Ox4, MAEW        Assessment & Plan:   Moderate persistent asthma This has been a stable interval for Jhovanny with his moderate persistent asthma. He has been able to titrate his Alvesco dose back down to 2 puffs twice a day. I have advised that he decrease it to 1 puff twice a day and continue at that dose for the remainder of the year. If he has a year of stability then he may start to consider stopping the inhaled corticosteroid during seasons when his shortness of breath and wheezing are not a problem.  I have also advised that he continue to use albuterol on an as-needed basis before exercise to prevent exercise-induced bronchospasm.  He should continue using Singulair.  He should  continue to get an annual flu shot.    Updated Medication List Outpatient Encounter Prescriptions as of 12/05/2014  Medication Sig  . cholecalciferol (VITAMIN D) 400 UNITS TABS tablet Take 400 Units by mouth daily.  . ciclesonide (ALVESCO) 160 MCG/ACT inhaler Inhale 2 puffs into the lungs 2 (two) times daily.   Marland Kitchen. doxycycline (VIBRAMYCIN) 100 MG capsule Take 100 mg by mouth 2 (two) times daily.   . lansoprazole (PREVACID) 30 MG capsule Take 15 mg by mouth daily.   . Loratadine (CLARITIN PO) Take by mouth every morning.    . metroNIDAZOLE (METROGEL) 0.75 % gel Apply 1 application topically 2 (two) times daily as needed.   . montelukast (SINGULAIR) 10 MG tablet Take 10 mg by mouth every morning.    . penciclovir (DENAVIR) 1 % cream Apply 1 application topically as needed.    Marland Kitchen. PROAIR HFA 108 (90 BASE) MCG/ACT inhaler Inhale 2 puffs into the lungs as needed.  . [DISCONTINUED] Vitamin D, Ergocalciferol, (DRISDOL) 50000 UNITS CAPS Take 50,000 Units by mouth every 7 (seven) days.

## 2015-10-19 IMAGING — CR DG CHEST 2V
2 series · 2 of 2 positions shown · non-contrast
Comparison: None.

CLINICAL DATA: Dyspnea on exertion for 2 months. History of asthma.

EXAM:
CHEST  2 VIEW

[view not recorded (1 of 2)]
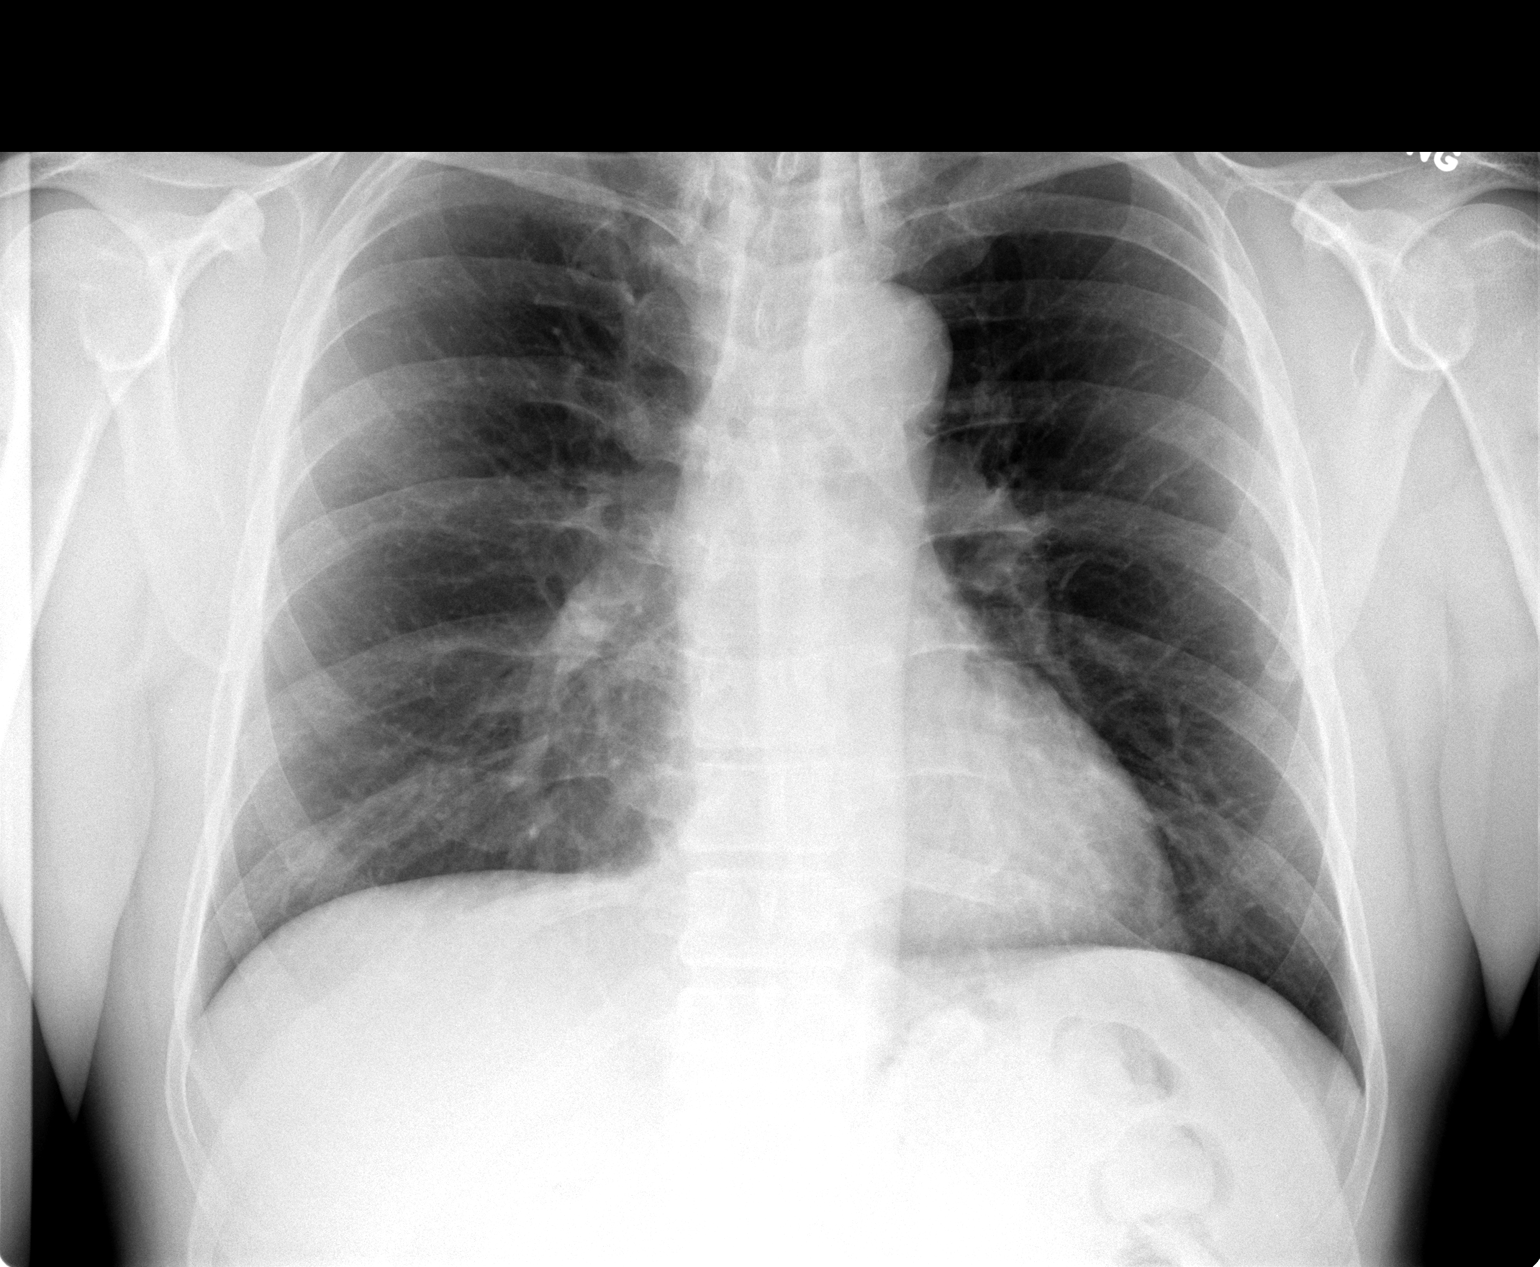

[view not recorded (2 of 2)]
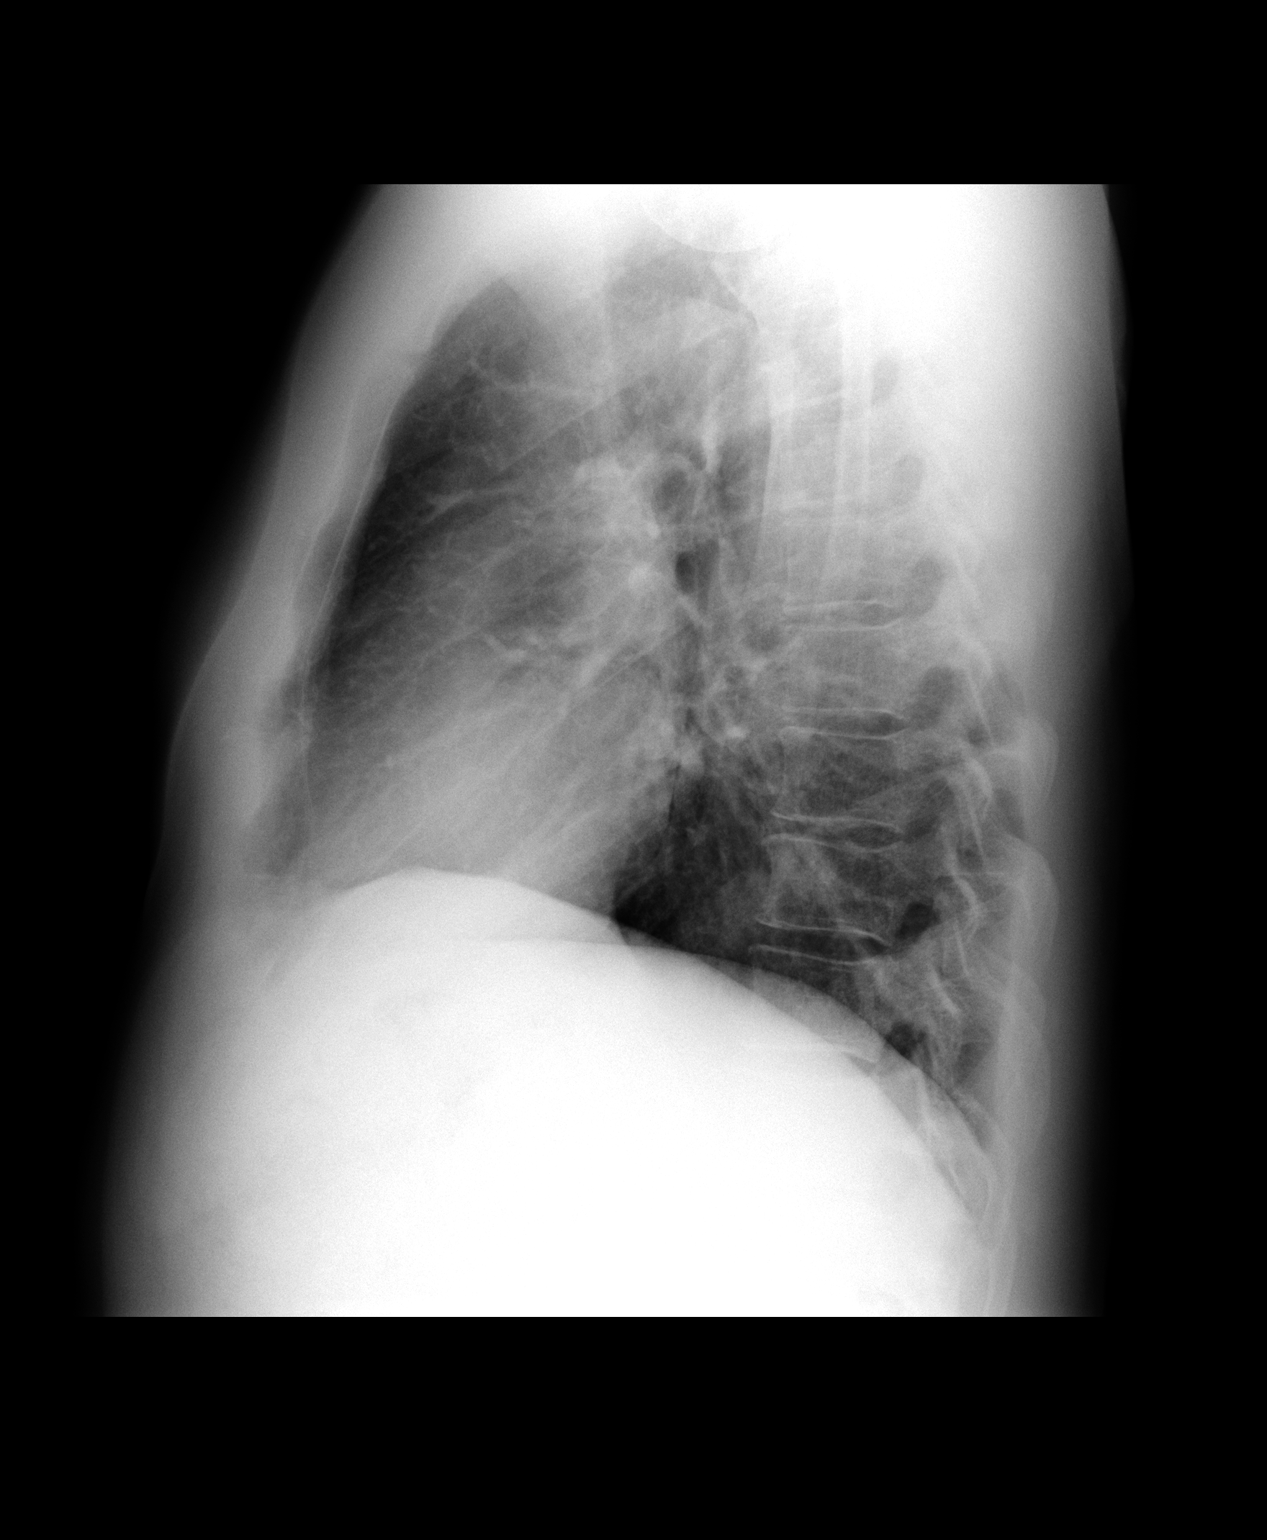

[2 of 2 positions shown; findings below may reference images not displayed]

FINDINGS: The cardiomediastinal silhouette is within normal limits. The lungs
are well inflated and clear. There is no evidence of pleural
effusion or pneumothorax. No acute osseous abnormality is
identified.
IMPRESSION: No active cardiopulmonary disease.

## 2016-01-06 ENCOUNTER — Ambulatory Visit (INDEPENDENT_AMBULATORY_CARE_PROVIDER_SITE_OTHER): Payer: BLUE CROSS/BLUE SHIELD | Admitting: Sports Medicine

## 2016-01-06 ENCOUNTER — Encounter: Payer: Self-pay | Admitting: Sports Medicine

## 2016-01-06 VITALS — BP 117/72 | HR 70 | Ht 69.0 in | Wt 168.0 lb

## 2016-01-06 DIAGNOSIS — M7742 Metatarsalgia, left foot: Secondary | ICD-10-CM | POA: Diagnosis not present

## 2016-01-06 DIAGNOSIS — M25562 Pain in left knee: Secondary | ICD-10-CM

## 2016-01-06 DIAGNOSIS — M25561 Pain in right knee: Secondary | ICD-10-CM | POA: Insufficient documentation

## 2016-01-06 NOTE — Progress Notes (Signed)
  Connor Lee - 60 y.o. male MRN [id number]  Date of birth: 1956/06/07  SUBJECTIVE:  Including CC & ROS.  Pt reports last spring he stopped his HEP/quad exercises that he had previously been given following his L medial meniscus tear. He stopped the exercises because he was several years out and not having any symptoms. Over the summer and fall he started noticing some pain on the bottom of his forefoot, radiating out to his 2nd and 3rd toes. The pain was most noticeable with walking and he generally walks 4-6 miles a day. He kept walking through the foot pain and then started noticing some soreness of his L knee and later his R knee only when going up hills.   HISTORY: Past Medical, Surgical, Social, and Family History Reviewed & Updated per EMR.   Pertinent Historical Findings include: L medial meniscus tear in 2012  PHYSICAL EXAM:  VS: BP:117/72 mmHg  HR:70bpm  TEMP: ( )  RESP:   HT:5\' 9"  (175.3 cm)   WT:168 lb (76.204 kg)  BMI:24.9 PHYSICAL EXAM: Knee: Normal to inspection with no erythema or effusion or obvious bony abnormalities. Palpation normal with no warmth, joint line tenderness, patellar tenderness, or condyle tenderness. ROM full in flexion and extension and lower leg rotation. Ligaments with solid consistent endpoints including ACL, PCL, LCL, MCL. Non painful patellar compression. Patellar glide with mild crepitus. Patellar and quadriceps tendons unremarkable.  Foot inspection and palpation reveals breakdown of the transverse arch and a drop of MT heads: Abnormal callous is present: 2nd and 3rd MT heads No TTP   ASSESSMENT & PLAN: See problem based charting & AVS for pt instructions.

## 2016-01-06 NOTE — Assessment & Plan Note (Signed)
Intermittent pain of both knees, anterior/medial, worse with long walks and going up hills, stopped quad exercises shortly before he noticed the pain - resume quad exercises to relieve strain on likely degenerative menisci - ok to continue walking as tolerated

## 2016-01-06 NOTE — Assessment & Plan Note (Addendum)
Moderate collapse of transverse arch with pain on plantar aspect of 2nd and 3rd metatarsal heads, radiation to toes - MT pad applied proximal to area of pain/collapse to relieve pressure, neutral gait without pain following application of pads - additional pads given for other shoes - f/u as needed if not improving over the next few weeks

## 2016-07-19 ENCOUNTER — Encounter: Payer: Self-pay | Admitting: Sports Medicine

## 2016-07-19 ENCOUNTER — Ambulatory Visit (INDEPENDENT_AMBULATORY_CARE_PROVIDER_SITE_OTHER): Payer: BLUE CROSS/BLUE SHIELD | Admitting: Sports Medicine

## 2016-07-19 DIAGNOSIS — M25562 Pain in left knee: Secondary | ICD-10-CM | POA: Diagnosis not present

## 2016-07-19 DIAGNOSIS — M7742 Metatarsalgia, left foot: Secondary | ICD-10-CM | POA: Diagnosis not present

## 2016-07-19 DIAGNOSIS — M25561 Pain in right knee: Secondary | ICD-10-CM

## 2016-07-19 NOTE — Assessment & Plan Note (Signed)
He has had a denerative meniscus on left and I think gets some medial pain with valgus stress such as skiing  No DJD  Good exam  Try compression sleeve and keep up HEP

## 2016-07-19 NOTE — Progress Notes (Signed)
CC: increased foot pain/ RT medial knee pain  MT pads helped left foot Now forefoot pain returning but under MTP! Not MTP 2 as before Hurts work after too much walking Sports insoles in all shoes help  RT medial knee pain Only with skiing Left medial knee pain in past was from deg. Meniscus  ROS No locking No giving way No swelling of knee  PE NAD BP 109/75   Pulse 63   Ht 5\' 9"  (1.753 m)   Wt 168 lb (76.2 kg)   BMI 24.81 kg/m   Knee: Normal to inspection with no erythema or effusion or obvious bony abnormalities. Palpation normal with no warmth or joint line tenderness or patellar tenderness or condyle tenderness. ROM normal in flexion and extension and lower leg rotation. Ligaments with solid consistent endpoints including ACL, PCL, LCL, MCL. Negative Mcmurray's and provocative meniscal tests. Non painful patellar compression. Patellar and quadriceps tendons unremarkable. Hamstring and quadriceps strength is normal.  Left foot Transverse arch flat with increased callus all the way to first MTP No dropped heads of MTs

## 2016-07-19 NOTE — Assessment & Plan Note (Signed)
WE changed from MT pad to MT cookie which is wider This seemed to help right away Will test this over the next mo

## 2018-02-21 ENCOUNTER — Encounter: Payer: Self-pay | Admitting: Sports Medicine

## 2018-02-21 ENCOUNTER — Ambulatory Visit (INDEPENDENT_AMBULATORY_CARE_PROVIDER_SITE_OTHER): Payer: BLUE CROSS/BLUE SHIELD | Admitting: Sports Medicine

## 2018-02-21 VITALS — BP 108/70 | Ht 69.0 in | Wt 168.0 lb

## 2018-02-21 DIAGNOSIS — M7652 Patellar tendinitis, left knee: Secondary | ICD-10-CM | POA: Diagnosis not present

## 2018-02-21 DIAGNOSIS — M7742 Metatarsalgia, left foot: Secondary | ICD-10-CM | POA: Diagnosis not present

## 2018-02-21 NOTE — Progress Notes (Signed)
Chief complaint: Follow-up of left knee and foot pain secondary to metarsalgia and left knee degenerative osteoarthritis, left patellar tendon pain x 2 months  History of present illness: Connor Lee is a 62 year old male who presents to the sports medicine office today for follow-up of left foot and knee pain.  He was last seen here in August 2017.  At that time, he was found to have metatarsalgia of left foot, as he did have flattening of the transverse arch.  Temporary green insoles were placed.  He was started off with metatarsal pad which did help.  This was switched to metatarsal cookie as he started getting pain under the first MTP.  He is an avid walker and reports that he occasionally does feel the pain with walking, but not since the metatarsal cookie was placed.  In regards to knee pain, he has had history of both left and right medial knee pain, has known history degenerative meniscal tearing in the left knee.  He reports that occasionally he will feel locking of his right knee.  He reports when he walks the more hyperextends his right knee.  He does not report of any swelling, popping, or giving way.  He does not report of any warmth, erythema, or ecchymosis noted otherwise.    For the last 2 months he has been noticing pain in his left knee.  He points specifically to patellar tendon right next to the tibial tuberosity as to where he feels the pain.  He does not report of any specific inciting incident, trauma, or injury to explain the pain. He is an avid water skier and snow skier, but has not done any of these activities since last August.  He reports no specific aggravating factors or alleviating factors otherwise.  He reports that 5 weeks ago when he made the appointment symptoms were at its worst.  Fortunately, today he does not report of any pain.  He does not report of any deep bending, squatting, jumping, or walking downhill as aggravating factors.  He has not had to use any medications for  pain.  He did get some new shoes a few weeks ago, noticed pain decreased after getting these new shoes.  Review of systems:  As stated above  Interval past medical history, surgical history, family history, and social history obtained and unchanged. His past medical history is notable for asthma, GERD, leukopenia, migraine,and rosacea; he does not report of any hospitalizations or surgeries; he does not report of any current tobacco use; family history is notable for asthma; allergies and medications are reviewed and are reflected in EMR  Physical exam: Vital signs are reviewed and are documented in the chart Gen.: Alert, oriented, appears stated age, in no apparent distress HEENT: Moist oral mucosa Respiratory: Normal respirations, able to speak in full sentences Cardiac: Regular rate, distal pulses 2+ Integumentary: No rashes on visible skin:  Neurologic: He does have intact strength of bilateral knee flexion and knee extension as well as ankle dorsiflexion, plantar flexion, inversion, eversion, would categorize these strengths as 5/5, sensation 2+ in bilateral lower extremities Psych: Normal affect, mood is described as good Musculoskeletal: Inspection of left knee reveals no obvious deformity or muscle atrophy, no warmth, erythema, ecchymosis, or effusion, he is tender palpation along the patellar tendon just proximal to the tibial tuberosity, no tenderness over the tuberosity itself, no tenderness over the quadriceps tendon, medial joint line, or lateral joint line, no signs of ligamentous instability as Lachman, anterior drawer, valgus, and varus  stress testing negative, Murray negative, he does have full knee range of motion, going from 0 to 135, no pain with jumping or squatting; Inspection of both of his feet reveal that he does have flattened transverse arch, he does have good longitudinal arch otherwise  Ultrasound of Left Knee  Limited musculoskeletal evaluation was performed today  of his left knee, specifically taking a look at the patellar tendon: -He does have hypoechoic changes noted at the distal patellar tendon next to its attachment at the tibial tuberosity -There are hypoechoic changes noted the patellar tendon bursa indicating patellar bursal swelling and inflammation -There is an area calcification about a centimeter proximally from the tibial tuberosity, with evidence of micro tearing of the distal patellar tendon -Where the area calcification is at, there is evidence of neovascularization as seen by color flow doppler  No suprapatellar pouch swelling No swelling along either joint line  Impression:  Left patellar tendinopathy with small partial tear involving less than 10 percent of patellar tendon distally, with infrapatellar bursal swelling  Ultrasound performed interpreted by: Haynes Kernshristopher Lake, MD and Juluis RainierKarl B Fields MD  Assessment and plan: 1. Left knee pain, with ultrasound evidence of patellar tendinopathy and partial tearing of the patellar tendon distally, with evidence of infrapatellar bursitis  2. Left foot metatarsalgia, symptoms resolved with green insport insoles with metatarsal cookie 3. 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   Given neovascularization, discussed his body is trying to heal this.  Unfortunately, due to history of migraines in his youth as well as history of rosacea, nitroglycerin is contraindicated.  Will have him start with Arnica gel to use 3 times daily over the next 6 weeks.  Discussed straight leg raises and short arc strengthening.  Discussed potential use of patellar banding.  Will have him return in 6 weeks for reevaluation and repeat ultrasound.  In regards to the foot pain, discussed continue with metatarsal cookie   In the insoles.  If he would like custom orthotics, discussed that he can certainly make an appointmenti and this can be made for him. Reassured him that his knee locking is related to some issues with his known degenerative meniscus changes.   Discussed to continue using knee brace that he has at home.   Haynes Kernshristopher Lake, M.D. Primary Care Sports Medicine Fellow [facility]  I observed and examined the patient with the [facility] and agree with assessment and plan.  Note reviewed and modified by me.    Enid BaasKarl Fields, MD

## 2018-02-21 NOTE — Patient Instructions (Signed)
Try Arnica Gel 3 times daily as needed.  You can find this OTC. Complete the exercises we discussed. See us in 4-6 weeks.

## 2018-04-04 ENCOUNTER — Ambulatory Visit: Payer: BLUE CROSS/BLUE SHIELD | Admitting: Sports Medicine

## 2018-04-06 ENCOUNTER — Encounter: Payer: BLUE CROSS/BLUE SHIELD | Admitting: Sports Medicine

## 2018-04-25 ENCOUNTER — Ambulatory Visit (INDEPENDENT_AMBULATORY_CARE_PROVIDER_SITE_OTHER): Payer: BLUE CROSS/BLUE SHIELD | Admitting: Sports Medicine

## 2018-04-25 DIAGNOSIS — M7742 Metatarsalgia, left foot: Secondary | ICD-10-CM | POA: Diagnosis not present

## 2018-04-26 NOTE — Progress Notes (Signed)
CC: Metatarsalgia  Patient likes to walk 3 to 4 miles per day Foot pain primarily on left fore foot We gave sports insole and MT pad This helped a lot However, he does not feel enough support  If he walks more than 2 miles Comes to try custom orthotics  Past Hx Patellar tendinitis  ROS Right foot almost pain free now Patellar tendon is non painful now  PE WDWM and in NAD BP 124/70   Ht  (1.753 m)   Wt 168 lb (76.2 kg)   BMI 24.81 kg/m   Left foot has drop of transverse arch Mild TTP distal 2nd MT Good ROM great toe Long arch neutral Patient was fitted for a : standard, cushioned, semi-rigid orthotic. The orthotic was heated and afterward the patient stood on the orthotic blank positioned on the orthotic stand. The patient was positioned in subtalar neutral position and 10 degrees of ankle dorsiflexion in a weight bearing stance. After completion of molding, a stable base was applied to the orthotic blank. The blank was ground to a stable position for weight bearing. Size:11 red EVA Base:Blue med EVA base Posting: MT cookie on left Additional orthotic padding: none  Gait after completion was very comfortable and neutral foot strike  Right foot Mild loss of transverse arch No pain to palpation over MTs

## 2018-04-26 NOTE — Assessment & Plan Note (Signed)
Preparation of custom orthotics today  Cont using MT padding  Will add some padding to dress shoes as well

## 2018-10-05 ENCOUNTER — Ambulatory Visit (INDEPENDENT_AMBULATORY_CARE_PROVIDER_SITE_OTHER): Payer: BLUE CROSS/BLUE SHIELD | Admitting: Sports Medicine

## 2018-10-05 ENCOUNTER — Ambulatory Visit: Payer: Self-pay

## 2018-10-05 ENCOUNTER — Encounter: Payer: Self-pay | Admitting: Sports Medicine

## 2018-10-05 VITALS — BP 111/71 | Ht 69.0 in | Wt 160.0 lb

## 2018-10-05 DIAGNOSIS — M25562 Pain in left knee: Secondary | ICD-10-CM

## 2018-10-05 DIAGNOSIS — M25561 Pain in right knee: Secondary | ICD-10-CM | POA: Diagnosis not present

## 2018-10-05 NOTE — Assessment & Plan Note (Signed)
Right knee noted on this visit  [place] confirms degenerative meniscus tear with small effusion  XT on Bike Compression sleeve Ice Avoid deep knee bends Reck if pain does not resolve 1 month

## 2018-10-05 NOTE — Progress Notes (Addendum)
  Connor Lee - 62 y.o. male MRN [id number]  Date of birth: 30-Nov-1955    SUBJECTIVE:      Chief Complaint:/ HPI:  62 year old male with approximately 1 month of 2/10 medial right knee pain.  [place]  He reports doing repetitive knee bending and kneeling.  Overall he notes that he is gotten significantly better but does still feel slightly unstable at times.  He is also concerned about activity and the risk of making his knee worse.  He initially was using ice.  He does not need any medications.  He denies any swelling or erythema.  He does report some feeling stiffness with full knee flexion.  Denies any numbness or tingling in the lower extremity.  No associated skin changes   ROS:     See HPI No giving way No locking  PERTINENT  PMH / PSH FH / / SH:  Past Medical, Surgical, Social, and Family History Reviewed & Updated in the EMR.    OBJECTIVE: BP 111/71   Ht 5\' 9"  (1.753 m)   Wt 160 lb (72.6 kg)   BMI 23.63 kg/m   Physical Exam:  Vital signs are reviewed.  GEN: Alert and oriented, NAD Pulm: Breathing unlabored PSY: normal mood, congruent affect  MSK: Right knee: - Inspection: no gross deformity. No swelling/effusion, erythema or bruising. Skin intact - Palpation: Medial joint line tenderness - ROM: full active ROM with flexion and extension in knee - Strength: 5/5 strength - Neuro/vasc: NV intact - Special Tests: - LIGAMENTS: negative anterior and posterior drawer, negative Lachman's, no MCL or LCL laxity  -- MENISCUS: negative McMurray's -- PF JOINT: nml patellar mobility and apprehension  MSK [place]:  Limited US of the right knee shows trace effusion around the medial meniscus.  There is a small effusion noted in suprapatellar pouch There is also a small tear observed at midline of medial meniscus with bulging  Joint space preserved Lateral meniscus normal Patellar tendon norm.  Impression:  Ultrasound  consistent with degenerative medial meniscus tear  Ultrasound and interpretation by Sibyl Parr. Salvatrice Morandi, MD   Left knee: No deformity or swelling No tenderness palpation Full active range of motion with 5/5 strength N/V intact   Hips: No pain with passive IR/ER bilaterally     ASSESSMENT & PLAN:  1.  Right knee pain -his right knee pain is likely due to suspected degenerative meniscus.  Overall his knee exam is reassuring and there is no evidence of collateral or cruciate ligament injury. - Ice as needed -Knee sleeve for comfort -Isometric quadricep strengthening exercises - Short course of NSAIDs for pain -Follow-up in 6 weeks as needed  I observed and examined the patient with the Dr Jamse Mead and agree with assessment and plan.  Note reviewed and modified by me. Connor Big, MD

## 2020-04-29 ENCOUNTER — Ambulatory Visit: Payer: Self-pay

## 2020-04-29 ENCOUNTER — Encounter: Payer: Self-pay | Admitting: Sports Medicine

## 2020-04-29 ENCOUNTER — Other Ambulatory Visit: Payer: Self-pay

## 2020-04-29 ENCOUNTER — Ambulatory Visit (INDEPENDENT_AMBULATORY_CARE_PROVIDER_SITE_OTHER): Payer: 59 | Admitting: Sports Medicine

## 2020-04-29 VITALS — BP 110/64 | Ht 69.0 in | Wt 163.0 lb

## 2020-04-29 DIAGNOSIS — M25531 Pain in right wrist: Secondary | ICD-10-CM

## 2020-04-29 NOTE — Patient Instructions (Addendum)
Please wear the brace regularly for the next 2 weeks, you do not need to sleep in this After 2 weeks, continue to use the brace when doing any laborious work Hospital doctor exercises You can also wear your knee sleeve when doing physical activity Follow-up with [place] in 4-6 weeks if no improvement in your wrist

## 2020-04-29 NOTE — Progress Notes (Signed)
[facility] [address] [place], [place] [address] Phone: [phone] Fax: [phone]   Patient Name: Connor Lee Date of Birth: [birth year] Medical Record Number: [id number] Gender: male Date of Encounter: 04/29/2020  SUBJECTIVE:      Chief Complaint:  Right wrist pain   HPI:  Connor Lee is a 64 yo RHD M presenting with right wrist pain for the last couple of months after moving into a new house requiring him to do a lot of lifting and using a hammer/pliers.  He has not injured this wrist before.  He denies any swelling.  He does feel weakness in his fourth and fifth fingers.  He denies any numbness or tingling.  No elbow pain.  Has been able to do everything he wants to, but has noticed pain when doing so.  He is also complaining of right knee pain.  Specifically, when he is going up and down stairs he will notice pain on the inside of his knee.  He denies any mechanical symptoms.  The pain is not waking him up at night.  No numbness or tingling into his foot.     ROS:     See HPI.   PERTINENT  PMH / PSH / FH / SH:  Past Medical, Surgical, Social, and Family History Reviewed & Updated in the EMR. Pertinent findings include:  Degenerative meniscus tear, SI joint dysfunction, metatarsalgia left foot, asthma   OBJECTIVE:  Ht 5\' 9"  (1.753 m)   Wt 163 lb (73.9 kg)   BMI 24.07 kg/m  Physical Exam:  Vital signs are reviewed.   GEN: Alert and oriented, NAD Pulm: Breathing unlabored PSY: normal mood, congruent affect  MSK: Right wrist No swelling or erythema Forearm sensation intact  ROM: Flexion, extension, radial and ulnar deviation full active and passive Full strength at wrist and fingers TTP over ulnar carpal joint Positive fovea sign Negative tinel tap  Negative phalens Negative froment's NVI  Right knee: Normal to inspection with no erythema or effusion or obvious bony abnormalities. TTP at posterior medial joint line ROM full  in flexion and extension and lower leg rotation. Ligaments with solid consistent endpoints including ACL, PCL, LCL, MCL. Negative Mcmurray's and Thessaly tests. Non painful patellar compression. Patellar glide without crepitus. Patellar and quadriceps tendons unremarkable. Hamstring and quadriceps strength is normal.  Neurovascularly intact.  Limited MSK ultrasound Right wrist Dorsal compartments 1-5 were visualized in short axis without abnormality Dorsal compartment 6 visualized the ECU and short axis with a bull's-eye sign and hypoechoic changes around tendon The TFCC was visualized in long axis demonstrating mild irregularity with spit in mid cartilage.  Impression: TFCC sprain with tendinitis of ECU tendon  MSK right Limited knee ultrasound   Suprapatellar pouch visualized in long and short axis with no abnormality. Quadriceps tendon visualized in both long and short axis without abnormality. Patellar Tendon is visualized in long and short axis without abnormality Medial meniscus visualized with intrasubstance tear MCL visualized with no abnormality   IMPRESSION:  Chronic degenerative medial meniscus tear   Ultrasound was performed and interpreted by Dr. and Ruta Hinds.  Fields, MD  ASSESSMENT & PLAN:   1. Right wrist sprain  There is a sprain to the TFCC likely as a result of the manual labor he has been doing on his house.  We have placed him in a wrist brace today to use for the next 2 weeks regularly, after that he can use it when just doing  laborious jobs.  Recommend ice to the area after any manual labor.  He can follow-up with [place] in 6 weeks if pain is not improved.  2.  Chronic degenerative right meniscus tear  Restart home exercise program to focus on quad strengthening.  It is okay to use over-the-counter anti-inflammatories as needed.  Please use your body helix compression sleeve when doing any physical activity, especially when waterskiing.  He can follow-up  with [place] on an as-needed basis.  Lanier Clam, DO, ATC Sports Medicine Fellow  I observed and examined the patient with Dr. Kathrynn Speed and agree with assessment and plan.  Note reviewed and modified by me. Ila Mcgill, MD

## 2020-06-10 ENCOUNTER — Ambulatory Visit (INDEPENDENT_AMBULATORY_CARE_PROVIDER_SITE_OTHER): Payer: 59 | Admitting: Sports Medicine

## 2020-06-10 ENCOUNTER — Other Ambulatory Visit: Payer: Self-pay

## 2020-06-10 DIAGNOSIS — M25531 Pain in right wrist: Secondary | ICD-10-CM

## 2020-06-10 NOTE — Assessment & Plan Note (Signed)
Acute, improved. He has resolution of his symptoms and his ultrasound findings today are consistent with his improvement with no evidence of continuing tendonitis or further disruption of the TFCC or ECU tendons. - Follow up as needed, activity as tolerated

## 2020-06-10 NOTE — Progress Notes (Signed)
    SUBJECTIVE:   CHIEF COMPLAINT / HPI:   Right Wrist Pain Patient was seen about 6 weeks ago for right wrist pain and diagnosed with TFCC sprain and possible partial cartilage tear. He has been wearing the wrist brace when active and has avoided lifting heavy objects. He has avoid activities he enjoys such as golf and water skiing not due to pain but because he was concerned he would cause further damage. Overall, he is much improved.   Right Knee Pain Patient with known chronic medial meniscus partial tear seen on ultrasound at last visit states with certain exercises do not cause pain but he does have a "clicking" sensation. He modifies the exercise and the clicking stops. Otherwise, he is tolerating all the other exercises well and doing them without issue. No pain on regular activities.  PERTINENT  PMH / PSH: None  OBJECTIVE:   BP 104/70   Ht 5\' 9"  (1.753 m)   Wt 163 lb (73.9 kg)   BMI 24.07 kg/m   Gen: NAD MSK: Wrist, Right: Inspection yielded no erythema, ecchymosis, bony deformity, or swelling. ROM full with good flexion and extension and ulnar/radial deviation that is symmetrical with opposite wrist. Palpation is normal over metacarpals, scaphoid, lunate, and TFCC; tendons without tenderness/swelling. Strength 5/5 in all directions without pain.  of Right Wrist Dorsal compartment 6 visualized in short and long axis without evidence of hypoechoic changes around the tendon. TFCC was visualized demonstrating resolution of the marked hypoechoic changes seen on last exam, no evidence of tendinitis of ECU tendon.  [place] of Right Knee Suprapatellarpouch visualized in long and short axis withno abnormality. Quadriceps tendonvisualizedinboth long and short axis without abnormality. Patellar Tendonis visualized in long and short axis without abnormality Medial meniscus visualized with intrasubstance tear that appears to be healing and is much less prominent MCL visualized with  no abnormality  Impression: Degenerative meniscus tear that appears stable with no excess swelling; healing strain of TFCC Right  Ultrasound and interpretation by [place]. Fields, MD   ASSESSMENT/PLAN:   Right wrist pain Acute, improved. He has resolution of his symptoms and his ultrasound findings today are consistent with his improvement with no evidence of continuing tendonitis or further disruption of the TFCC or ECU tendons. - Follow up as needed, activity as tolerated  Right Knee Pain Medial meniscus tear, improving. Avoid exercises or activities that reproduce "clicking" sensation. Otherwise, activity as tolerated. - Follow up as needed    Sibyl Parr, DO PGY-4, Sports Medicine Fellow [facility]  I observed and examined the patient with Dr. [facility] and agree with assessment and plan.  Note reviewed and modified by me. Karen Chafe, MD

## 2020-06-10 NOTE — Patient Instructions (Signed)
It was great to meet you today! Thank you for letting me participate in your care!  Today, we discussed your right wrist which is improved and it looked healed on ultrasound. You may now participate in activities and weight bearing exercises as tolerated. Your right knee meniscus still has a small tear but that appears to be improving as well. Avoid activities that cause pain or sensations of clicking or popping.  Be well, Jules Schick, DO [facility]

## 2021-01-08 ENCOUNTER — Ambulatory Visit
Admission: RE | Admit: 2021-01-08 | Discharge: 2021-01-08 | Disposition: A | Discharge status: Home / Self Care | Payer: 59 | Source: Ambulatory Visit | Attending: Sports Medicine | Admitting: Sports Medicine

## 2021-01-08 ENCOUNTER — Other Ambulatory Visit: Payer: Self-pay

## 2021-01-08 ENCOUNTER — Ambulatory Visit (INDEPENDENT_AMBULATORY_CARE_PROVIDER_SITE_OTHER): Payer: 59 | Admitting: Sports Medicine

## 2021-01-08 VITALS — BP 104/66 | Ht 69.0 in | Wt 166.0 lb

## 2021-01-08 DIAGNOSIS — M5412 Radiculopathy, cervical region: Secondary | ICD-10-CM

## 2021-01-08 MED ORDER — CYCLOBENZAPRINE HCL 10 MG PO TABS: 10.0000 mg | ORAL_TABLET | Freq: Every evening | ORAL | 0 refills | Status: DC | PRN

## 2021-01-09 NOTE — Progress Notes (Signed)
Office Visit Note   Patient: Connor Lee           Date of Birth: [birth year]           MRN: [id number] Visit Date: 01/08/2021 Requested by: Delorse Lek, MD [address] [place],  [place] [address] PCP: Delorse Lek, MD  Subjective: CC: Left superior shoulder pain  HPI: 65 year old male presenting to clinic with concerns of left superior shoulder pain, which he thinks has been ongoing since Christmas. He denies any trauma at the onset of his symptoms, but does state that he was lifting heavy furniture a few days before he started to notice the pain. Describes the pain as a tight burning sensation in his left superior trapezius. It radiates along his left deltoid, but not into the forearm or hand. He denies any weakness in the arm. He says that he is able to do most of his normal activities of daily living however he has pain when sleeping on his sides. He does have some neck pain in this area, but is not sure that it has worsened along with the symptoms. He is otherwise in good health.              ROS:   All other systems were reviewed and are negative.  Objective: Vital Signs: BP 104/66   Ht 5\' 9"  (1.753 m)   Wt 166 lb (75.3 kg)   BMI 24.51 kg/m   Physical Exam:  General:  Alert and oriented, in no acute distress. Pulm:  Breathing unlabored. HEENT: PEERLA Psy:  Normal mood, congruent affect. Skin: Left shoulder area with no bruising, rashes, or erythema. Overlying skin intact. Left shoulder Exam:  Inspection: Symmetric muscle mass, no atrophy or deformity, no scars. Palpation: Endorses tenderness palpation over the superior trapezius area. Tenderness within the left scalene musculature. No tenderness to palpation over and around the acromion, over the [facility] joint or biceps insertion.  Range of motion: Full range of motion in forward flexion, abduction and extension.  Full External rotation to 90 degrees. Apley scratch symmetrical at approximately level of  T6 vertebrae. No scapular winging.  Rotator cuff testing:  Full strength and no pain with empty can (supraspinatus).  External rotation with full strength and no pain. Full strength and no pain with Napoleon and lift off.  AC joint testing: No AC tenderness to palpation, negative scarf test. Negative active compression test.  Biceps testing: Negative speeds, negative Yergason.  Labral testing: O'Brien's/speeds with full strength and no pain. Crank test: Negative  Impingement testing: Mild pain with Hawkins  Spurling's negative bilaterally  Strength testing:  5 out of 5 strength with shoulder abduction (C5), wrist extension (C6), wrist flexion (C7), grip strength (C8), and finger abduction (T1).  Sensation: Intact to light touch throughout bilateral upper extremities.   Brisk distal capillary refill.   Imaging: DG Cervical Spine 2 or 3 views  Result Date: 01/08/2021 CLINICAL DATA:  Cervical radiculopathy. EXAM: CERVICAL SPINE - 2-3 VIEW COMPARISON:  None. FINDINGS: There is no acute fracture. No significant malalignment. No prevertebral soft tissue swelling. Mild multilevel disc height loss is noted throughout the cervical spine. There are carotid artery calcifications. IMPRESSION: No acute fracture or significant malalignment. Mild multilevel disc height loss. Electronically Signed   By: 03/08/2021 M.D.   On: 01/08/2021 22:30    Assessment & Plan: 65 year old male presenting to clinic with concerns of left shoulder pain, focused primarily in the superior aspect  of his trapezius. Given the duration of his pain, now approaching 2 months, this seems atypical for a simple spasm due to lifting furniture. Concern for possible cervical radiculopathy. -We will obtain cervical x-rays to help evaluate for any concerning degenerative changes that may underlie his symptoms. -Trial of Flexeril at night to relieve the spasm. -Provided with scapular stabilization exercises as well as  exercises to help strengthen his neck and shoulders. -Return to clinic in 4 to 6 weeks for reevaluation. -Patient expresses understanding with plan, he had no further questions or concerns today.   Procedures: No procedures performed     I observed and examined the patient with the [facility] resident and agree with assessment and plan.  Note reviewed and modified by me.  Karl B. Marily Lente

## 2021-02-12 ENCOUNTER — Encounter: Payer: 59 | Admitting: Sports Medicine

## 2021-03-19 ENCOUNTER — Ambulatory Visit (INDEPENDENT_AMBULATORY_CARE_PROVIDER_SITE_OTHER): Payer: 59 | Admitting: Sports Medicine

## 2021-03-19 ENCOUNTER — Other Ambulatory Visit: Payer: Self-pay

## 2021-03-19 VITALS — BP 116/68 | Ht 69.0 in | Wt 167.0 lb

## 2021-03-19 DIAGNOSIS — M2142 Flat foot [pes planus] (acquired), left foot: Secondary | ICD-10-CM | POA: Diagnosis not present

## 2021-03-19 DIAGNOSIS — M2141 Flat foot [pes planus] (acquired), right foot: Secondary | ICD-10-CM

## 2021-03-19 DIAGNOSIS — M7742 Metatarsalgia, left foot: Secondary | ICD-10-CM

## 2021-03-19 NOTE — Progress Notes (Signed)
   Office Visit Note   Patient: Connor Lee           Date of Birth: 1956/02/11           MRN: [id number] Visit Date: 03/19/2021 Requested by: Delorse Lek, MD [address] [place],  [place] [address] PCP: Delorse Lek, MD  Subjective: CC: New orthotics  HPI: 65 year old male presenting to clinic requesting new custom orthotics.  Patient with a history of pes planus causing bilateral foot pain, which has previously been very well controlled with custom orthotics.  Current pair is 56 to 66 years old, and he is overall very happy with them.  He says he would like a new pair today for pressure padding, as well as to put in additional pairs of shoes. At previous encounter, patient was concerned that he had pain in his left trapezius.  This has improved significantly with the home exercise program that he was previously prescribed.  He is happy with the recovery he has made thus far.  He says it no longer bothers him on a day-to-day basis.  He did not take the flexeril as he does not like to take meds              ROS:   All other systems were reviewed and are negative.  Objective: Vital Signs: BP 116/68   Ht 5\' 9"  (1.753 m)   Wt 167 lb (75.8 kg)   BMI 24.66 kg/m  No flowsheet data found.   No flowsheet data found.  Physical Exam:  General:  Alert and oriented, in no acute distress. Pulm:  Breathing unlabored. Psy:  Normal mood, congruent affect. Skin:  Bilateral feet without bruises, rashes, or erythema. Overlying skin intact.  Foot examination:  Normal Gait Bilateral feet with no obvious swelling, deformity.  Bilateral feet with mild pes planus. Left foot with transverse arch collapse upon standing.   Imaging: No results found.  Assessment & Plan: 65yo M presenting to clinic for new custom orthotics. Pes planus and transverse arch collapse previously a source of foot pain, though this has been well controlled with previous orthotics.  - New pair made,  which patient stated were very comfortable. - Encouraged to return to clinic if adjustments needed in future     Procedures: Patient was fitted for a : standard, cushioned, semi-rigid orthotic. The orthotic was heated and afterward the patient stood on the orthotic blank positioned on the orthotic stand. The patient was positioned in subtalar neutral position and 10 degrees of ankle dorsiflexion in a weight bearing stance. After completion of molding, a stable base was applied to the orthotic blank. The blank was ground to a stable position for weight bearing. Size: 11  Base: EVA Posting: None Additional orthotic padding: Metatarsal Cookie on Left

## 2021-04-07 ENCOUNTER — Other Ambulatory Visit: Payer: Self-pay

## 2021-04-07 ENCOUNTER — Encounter: Payer: 59 | Admitting: Sports Medicine

## 2021-04-23 ENCOUNTER — Ambulatory Visit (INDEPENDENT_AMBULATORY_CARE_PROVIDER_SITE_OTHER): Payer: 59 | Admitting: Sports Medicine

## 2021-04-23 ENCOUNTER — Other Ambulatory Visit: Payer: Self-pay

## 2021-04-23 VITALS — BP 122/72

## 2021-04-23 DIAGNOSIS — M2142 Flat foot [pes planus] (acquired), left foot: Secondary | ICD-10-CM

## 2021-04-23 DIAGNOSIS — M7742 Metatarsalgia, left foot: Secondary | ICD-10-CM

## 2021-04-23 DIAGNOSIS — M2141 Flat foot [pes planus] (acquired), right foot: Secondary | ICD-10-CM

## 2021-04-23 NOTE — Progress Notes (Signed)
   Office Visit Note   Patient: Connor Lee           Date of Birth: Dec 21, 1955           MRN: [id number] Visit Date: 04/23/2021 Requested by: Delorse Lek, MD [address] [place],  [place] [address] PCP: Delorse Lek, MD  Subjective: CC: Orthotics adjustment  HPI: 65 year old male presenting to clinic for adjustment of his previously made custom orthotics.  He states his left orthotic is uncomfortable, feeling as though he is constantly rolling to the inside of his foot.  A previous attempt to remedy this was made with a medial wedge, but patient states that this has offered an uncomfortable sensation throughout his midfoot.  He would like to know if he can have a new orthotic made today.  He is otherwise doing well with no additional concerns.              Objective: Vital Signs: BP 122/72  No flowsheet data found.   No flowsheet data found.  Physical Exam:  General:  Alert and oriented, in no acute distress. Pulm:  Breathing unlabored. Psy:  Normal mood, congruent affect. Skin: Bilateral feet without bruises, rashes, or erythema. Overlying skin intact.  Normal Gait Bilateral feet with no obvious swelling, deformity.  Bilateral feet with mild pes planus. Left foot with transverse arch collapse upon standing.   Imaging: None today.  Assessment & Plan: 65 year old male presenting to clinic for adjustments above previously made custom orthotics.  Due to his discomfort on the left, we remade a left orthotic today.  Patient stated that this was far more comfortable than previous.  Metatarsal cookie was applied due to transverse arch collapse.  Patient had neutral gait upon orthotic fitting. -Counseled to return to clinic should he notice any discomfort with his orthotics.     Procedures: Patient was fitted for a : standard, cushioned, semi-rigid orthotic. The orthotic was heated and afterward the patient stood on the orthotic blank positioned on the  orthotic stand. The patient was positioned in subtalar neutral position and 10 degrees of ankle dorsiflexion in a weight bearing stance. After completion of molding, a stable base was applied to the orthotic blank. The blank was ground to a stable position for weight bearing. Size: 11  Base: EVA Posting: None Additional orthotic padding: Metatarsal Cookie on Left   I observed and examined the patient with the SM  resident and agree with assessment and plan.  Note reviewed and modified by me. Sterling Big, MD

## 2021-05-13 NOTE — Progress Notes (Signed)
This encounter was created in error - please disregard. This encounter was created in error - please disregard. This encounter was created in error - please disregard. 

## 2021-10-15 ENCOUNTER — Ambulatory Visit (INDEPENDENT_AMBULATORY_CARE_PROVIDER_SITE_OTHER): Payer: 59 | Admitting: Sports Medicine

## 2021-10-15 DIAGNOSIS — M533 Sacrococcygeal disorders, not elsewhere classified: Secondary | ICD-10-CM | POA: Diagnosis not present

## 2021-10-15 NOTE — Assessment & Plan Note (Signed)
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

## 2021-10-15 NOTE — Progress Notes (Signed)
Connor Lee - 65 y.o. male MRN [id number]  Date of birth: December 18, 1955  SUBJECTIVE:   CC: R leg numbness and back tightness   [name] is a 65 yo male with PMHx of pes planus of both feet, metatarsalgia of L foot, cervical radiculopathy, and right wrist sprain who presents for several week history of intermittent R foot and leg numbness. He was doing his knee exercises when he noticed these exercises were causing back tightness. He stopped the exercises, however shortly thereafter he reports he stepped funny which caused a couple minutes of back tightness with right foot numbness and right lateral leg numbness.  He states this feels like a partial numbness with a little bit of tingling sensation.  The sensations last for couple minutes.  He denies back pain or radiating leg pain.  He does not experience this sensation when he is seated or lying down.  The sensation has brought up by standing a few minutes after getting up from a seated or lying down position.  For example, this morning he got up from bed went to the bathroom and experienced the sensation while he was brushing his teeth.  The sensation lasted for a few minutes and after adjusting his standing position the sensation went away.  He denies weakness in his right lower extremity as well as skin changes, swelling.  Since then he has only done his knee exercises without weights, and has noticed an improvement with less frequent numbness in his right leg.  He has concerns that this could be a sign of underlying pathology that could at some point become painful and therefore presents for evaluation.  ROS:   All other systems were reviewed and are negative.  PHYSICAL EXAM:  VS: BP:112/68  HR: bpm  TEMP: ( )  RESP:   HT:5\' 9"  (175.3 cm)   WT:166 lb (75.3 kg)  BMI:24.5 PHYSICAL EXAM: Gen: NAD, alert, cooperative with exam, well-appearing HEENT: clear conjunctiva,  CV:  no edema, extremities well perfused Resp:  non-labored Skin: no rashes, normal turgor  Neuro: alert and oriented, no gross deficits.  Psych: normal affect MSK: Back rotation, flexion, extension ROM intact.  5/5 strength hip flexors, hip abduction, knee extension, knee flexion, ankle dorsiflexion, ankle plantarflexion.  Walking with ankles external rotated normal without pain.  No weakness with heel-to-toe walking.  Straight leg raise negative.  ASSESSMENT & PLAN:  [name] is a 65 yo male with PMHx of pes planus of both feet, metatarsalgia of L foot, cervical radiculopathy, and right wrist sprain who presents for several week history of intermittent R foot and leg numbness.  Sensory symptoms consistent with L5/S1 dermatomal distribution.  Strength intact.  Sensory symptoms likely secondary to degenerative changes in the lumbar spine.  Reassuring that the symptoms are are already improving and he has no associated weakness.  We discussed continuing to work on exercises that can improve low back mobility and hip stability.  Plan: -HEP to include lateral hip raises, single-leg squat, stretches for hamstring/low back  All questions and concerns addressed.  Portions of this report may have been transcribed using voice recognition software. Every effort was made to ensure accuracy; however, inadvertent computerized transcription errors may be present.   Ellison Carwin, MD 10/15/21,  5:21 PM Pager: [phone] Internal Medicine Resident, PGY-1 [facility]   I observed and examined the patient with the IM resident and agree with assessment and plan.  Note reviewed and modified by me.  Sterling Big, MD

## 2021-10-27 ENCOUNTER — Ambulatory Visit: Payer: 59 | Admitting: Sports Medicine

## 2021-12-29 ENCOUNTER — Encounter: Payer: Self-pay | Admitting: Sports Medicine

## 2021-12-29 IMAGING — DX DG CERVICAL SPINE 2 OR 3 VIEWS
3 series · 3 of 3 positions shown · non-contrast
Comparison: None.

CLINICAL DATA: Cervical radiculopathy.

EXAM:
CERVICAL SPINE - 2-3 VIEW

[dg cervical spine 2 or 3 views (1 of 3)]
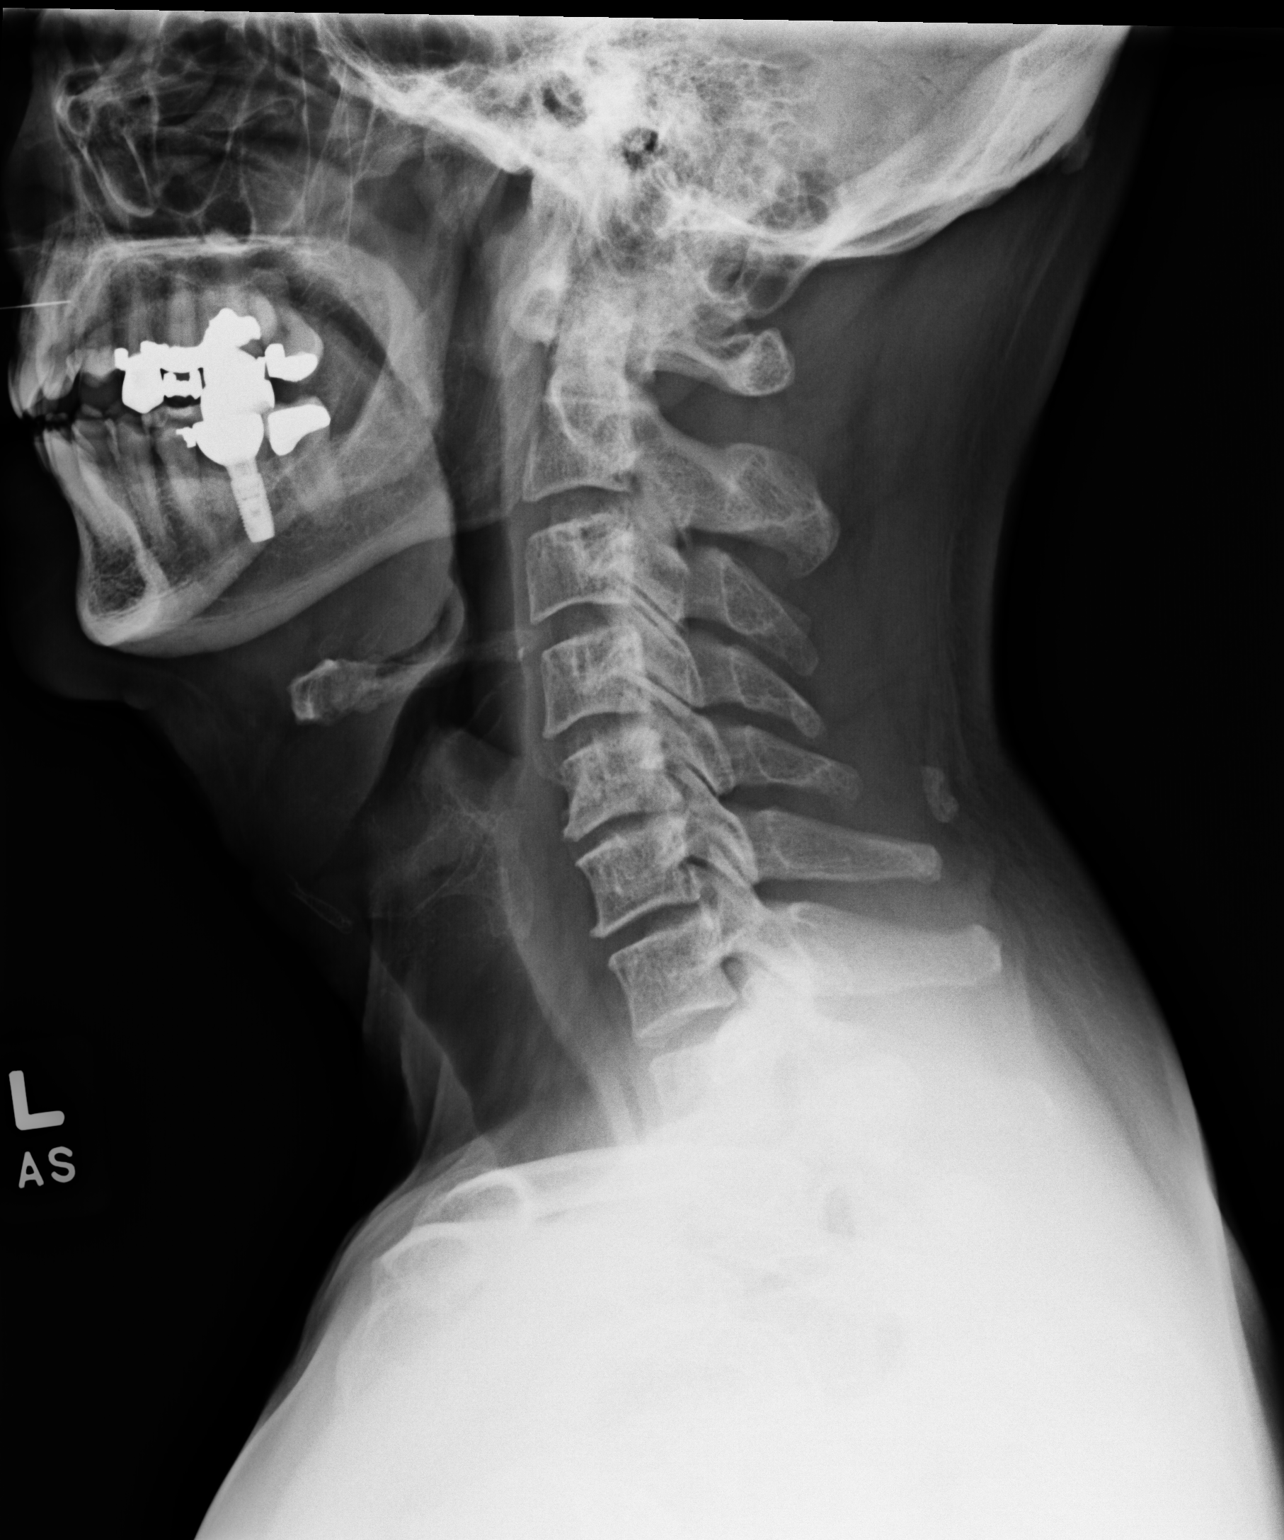

[dg cervical spine 2 or 3 views (2 of 3)]
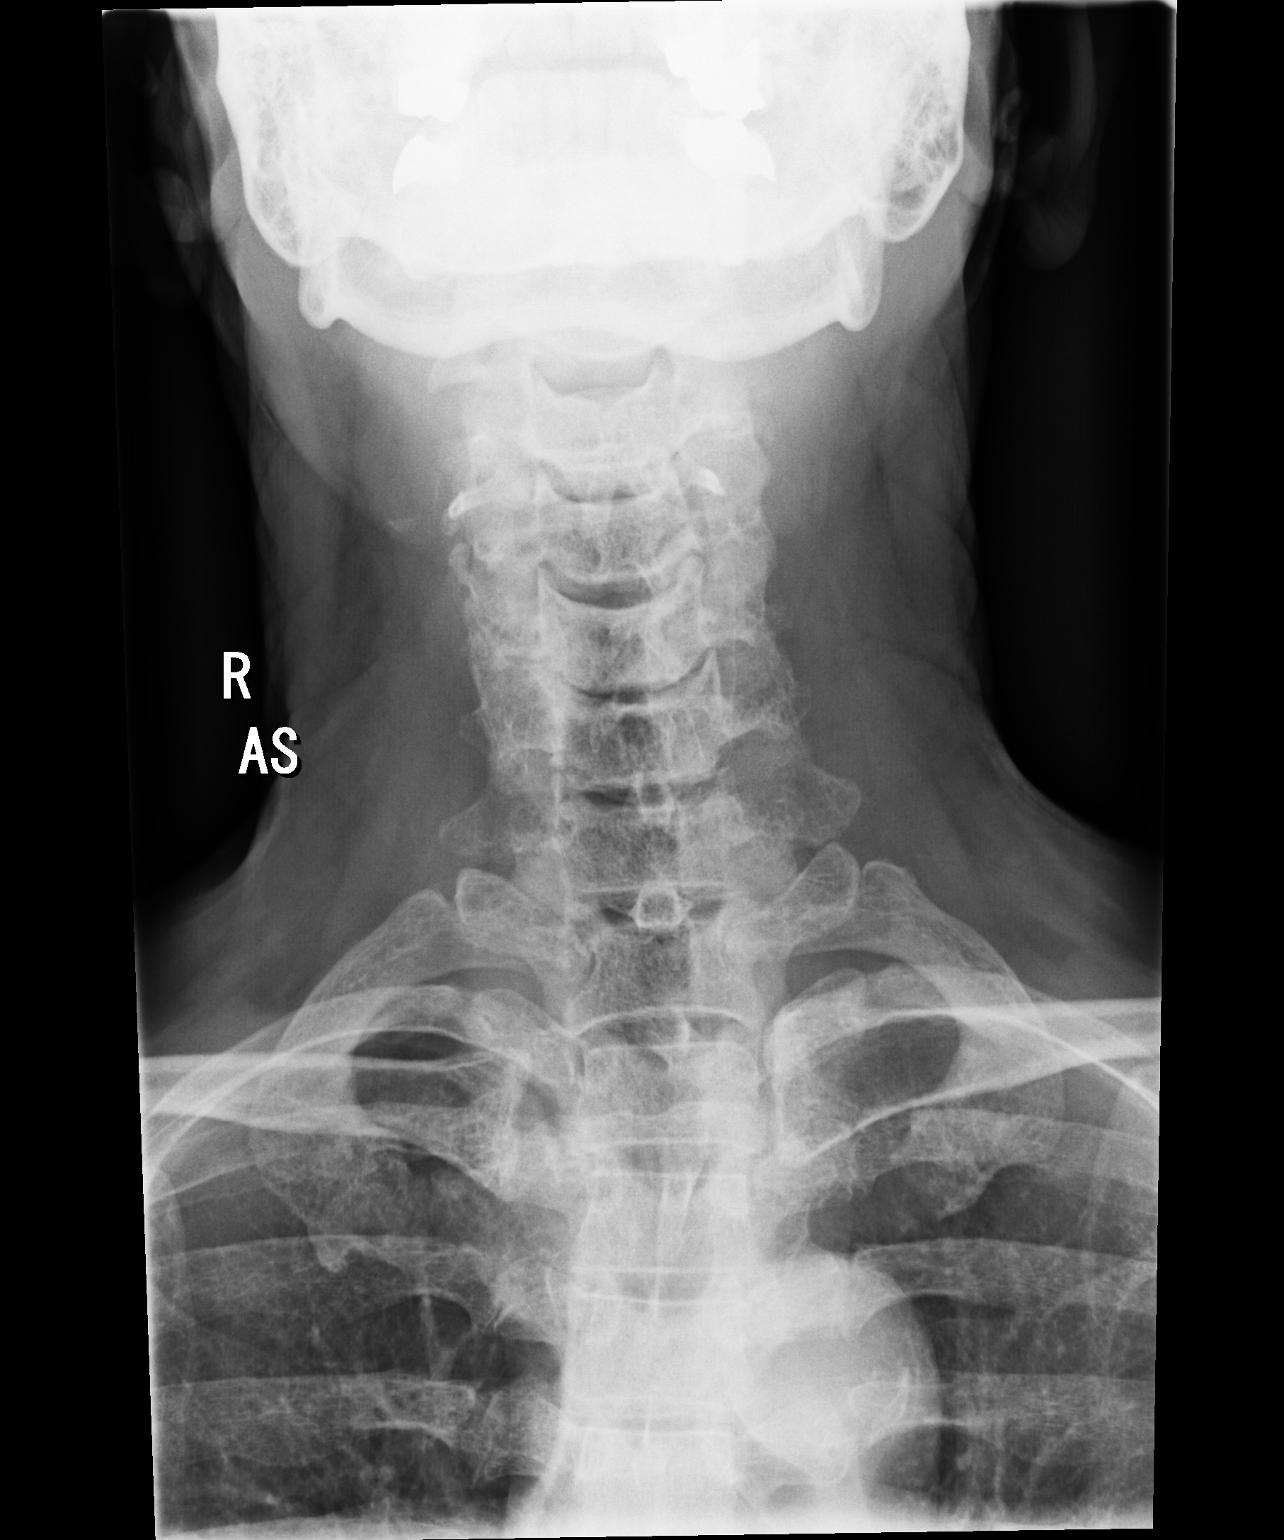

[dg cervical spine 2 or 3 views (3 of 3)]
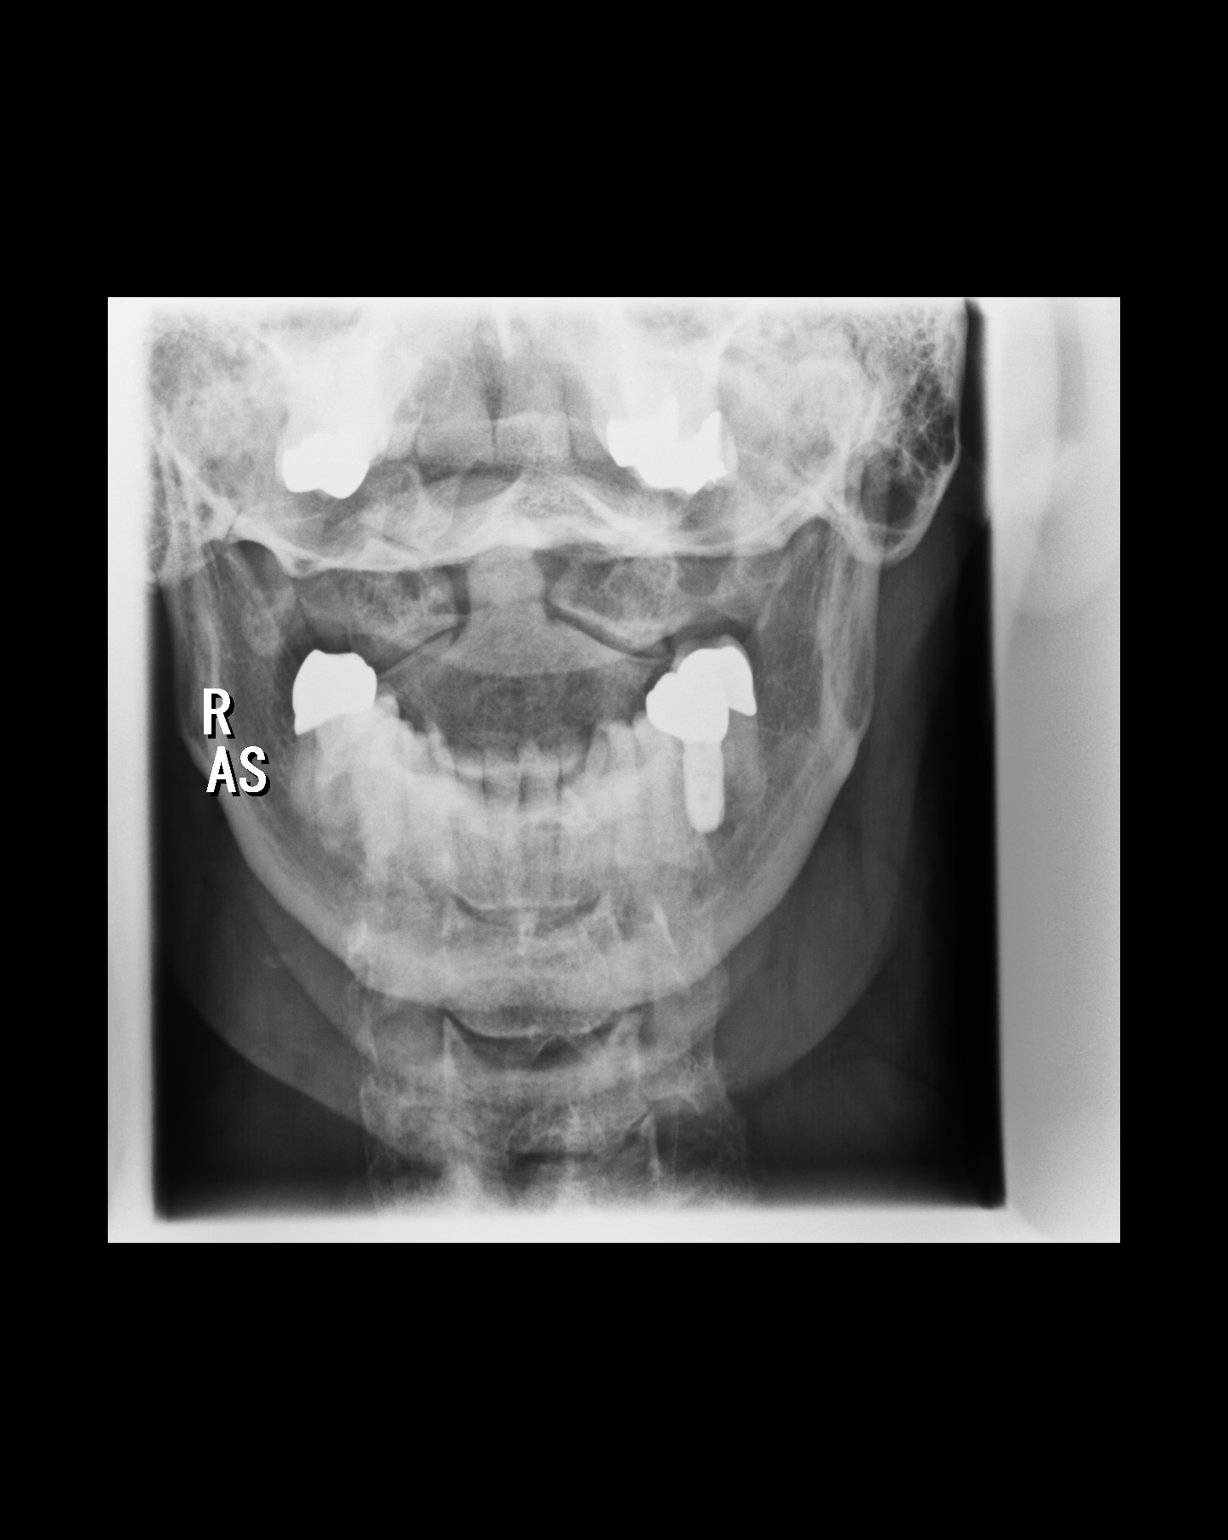

[3 of 3 positions shown; findings below may reference images not displayed]

FINDINGS: There is no acute fracture. No significant malalignment. No
prevertebral soft tissue swelling. Mild multilevel disc height loss
is noted throughout the cervical spine. There are carotid artery
calcifications.
IMPRESSION: No acute fracture or significant malalignment. Mild multilevel disc
height loss.

## 2022-01-14 ENCOUNTER — Ambulatory Visit (INDEPENDENT_AMBULATORY_CARE_PROVIDER_SITE_OTHER): Payer: 59 | Admitting: Sports Medicine

## 2022-01-14 DIAGNOSIS — M23203 Derangement of unspecified medial meniscus due to old tear or injury, right knee: Secondary | ICD-10-CM | POA: Diagnosis not present

## 2022-01-14 NOTE — Progress Notes (Signed)
PCP: Delorse Lek, MD  Subjective:   HPI: Patient is a 66 y.o. male here for right knee pain.  He states he has had issues with his meniscus, L has been doing well but R knee has been giving out on him at times. Was seen in the clinic in 2021 for this knee and ultrasound showed TFCC sprain/degenerative meniscus,  and was given exercises to do. Feels his knee has been worsening recently. States he feels it weaken most when walking more up steps or down steps. Doesn't notice it when walking on flat ground. Does endorse some pain on medial side of knee. Sometimes will take motrin but does not like to take it often.  Has been doing exercises for about 10 years       Objective:  Physical Exam:  Gen: NAD, comfortable in exam room  R Knee: - Inspection: no gross deformity b/l. No swelling/effusion, erythema or bruising b/l. Skin intact - Palpation: no TTP b/l - ROM: full active ROM with flexion and extension in knee and hip  - Strength: 5/5 strength  - Neuro/vasc: NV intact distally b/l - Special Tests: - LIGAMENTS: negative anterior and posterior drawer, negative Lachman's, no MCL or LCL laxity  -- MENISCUS: negative McMurray's, positive Thessaly  -- PF JOINT: nml patellar mobility bilaterally. negative patellar grind, negative patellar apprehension  Hips: normal ROM, negative FABER and FADIR bilaterally   Ultraound of Knee Right No swelling in SPP Medial joint line shows some minot changes over meniscus with bulging Looks better than prior scans Lateral meniscus looks normal PT and QT normal  Impression: some mild degenerative change in medial meniscus  Ultrasound and interpretation by Sibyl Parr. Fields, MD    Assessment & Plan:  1. R knee pain  Patient presents with chronic medial knee pain that he feels has been giving out when he walks up and down steps/ hills. On exam he had great VMO and abductor strength so unlikely related to that. Ultrasound performed which showed the  meniscus improved from last [place] but still pinched. Will plan to keep up the exercises, wear knee brace with exercise. Return for worsening pain.   I observed and examined the patient with the resident and agree with assessment and plan.  Note reviewed and modified by me. Sterling Big, MD

## 2022-01-15 NOTE — Assessment & Plan Note (Signed)
He has a chronic degenerative tear that is pretty stable  Caution about twisting and stairs  HEP cpmpression

## 2022-08-10 ENCOUNTER — Ambulatory Visit (INDEPENDENT_AMBULATORY_CARE_PROVIDER_SITE_OTHER): Payer: 59 | Admitting: Sports Medicine

## 2022-08-10 DIAGNOSIS — M778 Other enthesopathies, not elsewhere classified: Secondary | ICD-10-CM | POA: Insufficient documentation

## 2022-08-10 NOTE — Assessment & Plan Note (Signed)
Since he is having minimal pain we will use topical Voltaren Begin light weight exercises to emphasize triceps strength Okay to resume paddle boarding on an every other day basis at a mild level If not resolved in 4 weeks recheck with [place]

## 2022-08-10 NOTE — Progress Notes (Signed)
Chief complaint left elbow pain  Patient has been doing some paddle boarding as regular exercise.  This requires a lot of triceps activity.  He noted some significant swelling around his elbow starting about 10 days ago.  He wondered if it was related to the paddle boarding activity he did because he also had a bite from a fire ant which caused local swelling and a reaction in the same left arm.  When he got some chest tightness and more systemic reaction he was put on prednisone.  The prednisone is taken most of the swelling out of his elbow.  He states that he has very little pain at this time.  He did feel that the back of the elbow was puffy like he had a bursitis.  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 Sibyl Parr, MD

## 2022-08-11 ENCOUNTER — Encounter: Payer: Self-pay | Admitting: Sports Medicine

## 2022-09-30 ENCOUNTER — Ambulatory Visit (INDEPENDENT_AMBULATORY_CARE_PROVIDER_SITE_OTHER): Payer: 59 | Admitting: Sports Medicine

## 2022-09-30 VITALS — BP 102/62 | Ht 69.0 in | Wt 168.0 lb

## 2022-09-30 DIAGNOSIS — M23203 Derangement of unspecified medial meniscus due to old tear or injury, right knee: Secondary | ICD-10-CM | POA: Diagnosis not present

## 2022-09-30 DIAGNOSIS — M778 Other enthesopathies, not elsewhere classified: Secondary | ICD-10-CM | POA: Diagnosis not present

## 2022-09-30 NOTE — Progress Notes (Signed)
Connor Lee - 66 y.o. male MRN [id number]  Date of birth: 07-Sep-1956    CHIEF COMPLAINT:   L elbow, R knee, R hip    SUBJECTIVE:   HPI:   pleasant 66 year old male comes to clinic to be evaluated for left elbow, right knee, right hip.  Left elbow - was seen here on 08/10/2022 for left elbow complaint after paddle boarding.  He was diagnosed with tendinitis of the left triceps.  He put Voltaren gel on it.  The next day subsequently developed some swelling of the left olecranon bursa.  See his patient message on 9/13 for pictures.  He stopped using Voltaren gel in the elbow and rested from activities.  Today he reports the left elbow is feeling much better.  There is no pain.  There is no swelling.  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  It intermittently swells up and flares up on him.  He had been doing maintenance exercises but got away from those recently.  Over the last 1 to 2 months the right knee is again been bothering him medially, dull and achy and throbbing, especially with bending/long walking.  He has been icing it and using topical Voltaren there which has been helping to some degree.  He tries to avoid taking NSAIDs.  Right Hip - developed some right hip discomfort with walking in the last few weeks.  Thinks he is compensating for the right knee pain.  Describes sharp pain at the top of the posterior and lateral aspect of the right hip.  It does not radiate.  He does not have any numbness or tingling down the leg.  ROS:     See HPI  PERTINENT  PMH / PSH FH / / SH:  Past Medical, Surgical, Social, and Family History Reviewed & Updated in the EMR.  Pertinent findings include:  none  OBJECTIVE: BP 102/62   Ht 5\' 9"  (1.753 m)   Wt 168 lb (76.2 kg)   BMI 24.81 kg/m   Physical Exam:  Vital signs are reviewed.  GEN: Alert and oriented, NAD Pulm: Breathing unlabored PSY: normal mood, congruent  affect  MSK: Left elbow -no obvious deformity or swelling.  No erythema or warmth.  Nontender to palpation over medial or lateral epicondyle or olecranon.  Full range of motion flexion extension.  5/5 strength throughout.  No ligamentous instability.  R Knee -no obvious deformity or swelling.  Tender to palpation at the medial joint line.  Nontender to palpation of the lateral joint line or over the patella.  Full range of motion in flexion extension.  5/5 strength.  No ligamentous instability with valgus or varus stressing.  Negative Lachman.  Positive Thessaly test that elicits pain medially.  R Hip -tender to palpation posterior laterally over gluteal muscles/TFL.  Nontender along IT band distally.  Full range of motion hip flexion and abduction.  5/5 strength in both directions.  Negative logroll.  Negative FADIR and FABER test.  Negative Stinchfield.  ASSESSMENT & PLAN:  1.  Left olecranon bursitis -Resolved. No signs of bursitis/infection today. Counseled to avoid shearing forces on elbow. Compression as needed.  2. 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  Advised him to restart his quad strengthening exercises.  He will wear compression sleeve with activity.  Continue topical Voltaren gel.  We will hold off on a cortisone injection until this is  really swollen and bothersome in the future.  3. Right Hip Pain -This is an acute uncomplicated issue.  Likely due to imbalance from compensating to the right knee.  Advised him to work on hip abduction strength and IT band stretches.  Suspect this will get better with conservative treatment.  All questions answered and he agrees to plan.   Arvella Nigh, MD PGY-4, Sports Medicine Fellow [facility]   I observed and examined the patient with the [facility] resident and agree with assessment and plan.  Note reviewed and modified by me. Sterling Big, MD

## 2022-09-30 NOTE — Assessment & Plan Note (Signed)
Degenerative tear  See plan to resume exercise and compressoin sleeve

## 2022-09-30 NOTE — Assessment & Plan Note (Signed)
See summary - doing much better Conservative care

## 2022-12-29 DIAGNOSIS — R051 Acute cough: Secondary | ICD-10-CM | POA: Diagnosis not present

## 2022-12-29 DIAGNOSIS — U071 COVID-19: Secondary | ICD-10-CM | POA: Diagnosis not present

## 2022-12-29 DIAGNOSIS — R509 Fever, unspecified: Secondary | ICD-10-CM | POA: Diagnosis not present

## 2022-12-29 DIAGNOSIS — J45909 Unspecified asthma, uncomplicated: Secondary | ICD-10-CM | POA: Diagnosis not present

## 2023-06-21 DIAGNOSIS — K219 Gastro-esophageal reflux disease without esophagitis: Secondary | ICD-10-CM | POA: Diagnosis not present

## 2023-06-21 DIAGNOSIS — Z125 Encounter for screening for malignant neoplasm of prostate: Secondary | ICD-10-CM | POA: Diagnosis not present

## 2023-06-21 DIAGNOSIS — Z0000 Encounter for general adult medical examination without abnormal findings: Secondary | ICD-10-CM | POA: Diagnosis not present

## 2023-06-21 DIAGNOSIS — J4540 Moderate persistent asthma, uncomplicated: Secondary | ICD-10-CM | POA: Diagnosis not present

## 2023-06-21 DIAGNOSIS — R5383 Other fatigue: Secondary | ICD-10-CM | POA: Diagnosis not present

## 2023-06-21 DIAGNOSIS — J3089 Other allergic rhinitis: Secondary | ICD-10-CM | POA: Diagnosis not present

## 2023-06-21 DIAGNOSIS — Z13220 Encounter for screening for lipoid disorders: Secondary | ICD-10-CM | POA: Diagnosis not present

## 2023-08-25 DIAGNOSIS — L821 Other seborrheic keratosis: Secondary | ICD-10-CM | POA: Diagnosis not present

## 2023-08-25 DIAGNOSIS — D235 Other benign neoplasm of skin of trunk: Secondary | ICD-10-CM | POA: Diagnosis not present

## 2023-08-25 DIAGNOSIS — D2362 Other benign neoplasm of skin of left upper limb, including shoulder: Secondary | ICD-10-CM | POA: Diagnosis not present

## 2023-08-25 DIAGNOSIS — D1801 Hemangioma of skin and subcutaneous tissue: Secondary | ICD-10-CM | POA: Diagnosis not present

## 2023-08-30 DIAGNOSIS — M1711 Unilateral primary osteoarthritis, right knee: Secondary | ICD-10-CM | POA: Diagnosis not present

## 2023-08-30 DIAGNOSIS — G8929 Other chronic pain: Secondary | ICD-10-CM | POA: Diagnosis not present

## 2023-08-30 DIAGNOSIS — M25561 Pain in right knee: Secondary | ICD-10-CM | POA: Diagnosis not present

## 2023-09-20 ENCOUNTER — Ambulatory Visit (INDEPENDENT_AMBULATORY_CARE_PROVIDER_SITE_OTHER): Payer: BC Managed Care – PPO | Admitting: Sports Medicine

## 2023-09-20 ENCOUNTER — Other Ambulatory Visit: Payer: Self-pay

## 2023-09-20 VITALS — BP 118/72 | Ht 69.0 in | Wt 168.0 lb

## 2023-09-20 DIAGNOSIS — M25562 Pain in left knee: Secondary | ICD-10-CM | POA: Diagnosis not present

## 2023-09-20 DIAGNOSIS — M5010 Cervical disc disorder with radiculopathy, unspecified cervical region: Secondary | ICD-10-CM | POA: Insufficient documentation

## 2023-09-20 DIAGNOSIS — M25561 Pain in right knee: Secondary | ICD-10-CM

## 2023-09-20 NOTE — Progress Notes (Signed)
Chief complaint right knee pain and stiffness; secondary complaint radicular symptoms down the right arm.  Patient was first seen for right medial knee pain by me about 5 years ago.  At that time his ultrasound showed degenerative meniscus medially but pretty good joint space and not much sign of arthritis.  Recently the right knee has had more days of pain and even some swelling.  He went to see Dr. Morton Amy at [facility] and had a very good evaluation which showed some medial compartment osteoarthritis on x-ray of the right knee and by exam a suspected degenerative meniscus medially.  He also has some mild left knee pain but it is different and that it does not seem to be associated with stiffness or swelling.  When sitting or driving with his knee bent he occasionally gets a sharp burning type of pain into the lateral aspect of the left knee near the joint line  His secondary complaint is some burning and tingling down the lateral right arm all the way to his fingers.  He states this happened after working on his back and stretching underneath his right to try to clean things off the bottom.  The tingling started and has persisted since then.  In the past we have x-rayed his neck and found some degenerative cervical disc changes.  Physical exam Pleasant white male in no acute distress BP 118/72   Ht 5\' 9"  (1.753 m)   Wt 168 lb (76.2 kg)   BMI 24.81 kg/m   Right knee lacks about 3 degrees of full extension and shows some puffiness in the suprapatellar pouch He has full flexion but it is about 10 degrees less than the left knee No real tenderness is noted today and McMurray's testing is negative Ligaments are stable  Left knee   Normal to inspection with no erythema or effusion or obvious bony abnormalities. Palpation normal with no warmth or joint line tenderness or patellar tenderness or condyle tenderness. ROM normal in flexion and extension and lower leg rotation. Ligaments  with solid consistent endpoints including ACL, PCL, LCL, MCL. Negative Mcmurray's and provocative meniscal tests. Non painful patellar compression. Patellar and quadriceps tendons unremarkable. Hamstring and quadriceps strength is normal. Point of maximal tenderness is proximal to the left iliotibial band between the insertion of the band and the lateral knee  Neck exam showed a full range of motion with no significant tenderness on testing.  His neurologic symptoms are not present today on testing and his strength is normal  Ultrasound of right knee There is a moderate effusion in the suprapatellar pouch extending about 8 cm above the patella The medial meniscus shows a clear degenerative split.  There is a pseudo meniscal cyst with effusion around an extruded portion of the meniscus Lateral meniscus appears intact Quadriceps and patellar tendons are unremarkable Trochlea is unremarkable  Impression: Moderate knee effusion; significant degenerative meniscal changes and some early joint space loss on the medial compartment.  Left knee  Suprapatellar pouch is without effusion Quadriceps and patellar tendons are normal Medial and lateral meniscus appear normal Trochlea is unremarkable Just proximal to the iliotibial band is a very prominent nerve that may be somewhat enlarged and is positioned at his point of maximal tenderness  Impression: No significant tissue damage noted to the left knee.  There is a superficial nerve that appears enlarged proximal to the iliotibial band  Ultrasound and interpretation by Royal Hawthorn B. Darrick Penna, MD

## 2023-09-20 NOTE — Assessment & Plan Note (Signed)
I suggested being careful of postural positions that aggravate the radiculopathy He can start some vitamin B6 at 100 mg a day As needed anti-inflammatory medicines for pain Keep up the neck exercises and avoid significant traction or stretch to the neck

## 2023-09-20 NOTE — Assessment & Plan Note (Signed)
Today I advised him that the right knee probably needs compression when he is active because he is getting significant swelling Use a lot of ice particularly after activities Over-the-counter Aleve for pain as needed I did reassure him he has a very functional lady and I do not think he needs to consider knee replacement until it progresses to be a lot worse than now  Left knee may have a superficial nerve impingement near the iliotibial band We gave him iliotibial band stretches to see if this was being bothered by some adhesions He will use topical Voltaren on the area  We will recheck this in a few months

## 2023-12-12 ENCOUNTER — Encounter: Payer: Self-pay | Admitting: Sports Medicine

## 2023-12-27 ENCOUNTER — Other Ambulatory Visit: Payer: Self-pay

## 2023-12-27 ENCOUNTER — Encounter: Payer: Self-pay | Admitting: Sports Medicine

## 2023-12-27 ENCOUNTER — Ambulatory Visit (INDEPENDENT_AMBULATORY_CARE_PROVIDER_SITE_OTHER): Payer: BC Managed Care – PPO | Admitting: Sports Medicine

## 2023-12-27 VITALS — BP 110/60 | Ht 69.0 in | Wt 169.0 lb

## 2023-12-27 DIAGNOSIS — M79641 Pain in right hand: Secondary | ICD-10-CM

## 2023-12-27 DIAGNOSIS — M6530 Trigger finger, unspecified finger: Secondary | ICD-10-CM

## 2023-12-27 MED ORDER — METHYLPREDNISOLONE ACETATE 40 MG/ML IJ SUSP
20.0000 mg | Freq: Once | INTRAMUSCULAR | Status: AC
  Administered 2023-12-27: 20 mg via INTRA_ARTICULAR

## 2023-12-27 NOTE — Progress Notes (Signed)
PCP: Connor Lee, Connor Lee  Chief Complaint: Right thumb pain  Subjective:   HPI: Patient is a 68 y.o. male here for right thumb pain that has been bothering him for the past few weeks now.  Patient states that he recently had a grandchild and anytime he picked him up her up he noticed that his hand would sometimes hurt and sometimes he would feel like it would get caught in extension and he would have to pull it back into flexion.  Patient states he never had injury to this hand before and never had anything like this in the past.  Patient states that he keeps in flexion for fear that when he extends it too much it will sublux out.  This is happening at the DIP.  Painful nodule at base of thumb over MCP  Past Medical History:  Diagnosis Date   Asthma    Chronic neck pain    GERD (gastroesophageal reflux disease)    Leukopenia    Migraine headache    Rosacea    Weight loss     Current Outpatient Medications on File Prior to Visit  Medication Sig Dispense Refill   ATRALIN 0.05 % gel      cholecalciferol (VITAMIN D) 400 UNITS TABS tablet Take 400 Units by mouth daily.     ciclesonide (ALVESCO) 160 MCG/ACT inhaler Inhale 2 puffs into the lungs 2 (two) times daily.      ciclopirox (LOPROX) 0.77 % SUSP APPLY 10-12 DROPS TO SCAPL DAILY AS NEEDED FOR FLARES  0   clindamycin (CLEOCIN T) 1 % external solution APPLY TO POSTERIOR SCALP DAILY  0   cyclobenzaprine (FLEXERIL) 10 MG tablet Take 1 tablet (10 mg total) by mouth at bedtime as needed for muscle spasms. 30 tablet 0   doxycycline (VIBRAMYCIN) 100 MG capsule Take 100 mg by mouth 2 (two) times daily.      lansoprazole (PREVACID) 30 MG capsule Take 15 mg by mouth daily.      Loratadine (CLARITIN PO) Take by mouth every morning.       metroNIDAZOLE (METROGEL) 0.75 % gel Apply 1 application topically 2 (two) times daily as needed.      montelukast (SINGULAIR) 10 MG tablet Take 10 mg by mouth every morning.       mupirocin ointment (BACTROBAN)  2 % APPLY SPARINGLY TO AFFECTED AREA(S) TWICE DAILY  3   penciclovir (DENAVIR) 1 % cream Apply 1 application topically as needed.       PROAIR HFA 108 (90 BASE) MCG/ACT inhaler Inhale 2 puffs into the lungs as needed.     Spacer/Aero-Holding Chambers (EASIVENT) inhaler USE FOR INHALER  3   No current facility-administered medications on file prior to visit.    No past surgical history on file.  Allergies  Allergen Reactions   Penicillins     Childhood allergy    BP 110/60   Ht 5\' 9"  (1.753 m)   Wt 169 lb (76.7 kg)   BMI 24.96 kg/m      10/15/2021    2:16 PM  [facility]  Frequency of aerobic exercise (# of days/week) 6  Average time in minutes 65  Frequency of strengthening activities (# of days/week) 0        No data to display              Objective:  Physical Exam:  Gen: NAD, comfortable in exam room  Right hand: Inspection of the right hand reveals  no gross abnormalities.  There is tenderness to palpation over the PIP and MCP, there is a nodule noted over the MCP on the volar side.  Range of motion is appropriate however when patient goes into full extension he is subluxing at the DIP.  This is happening dorsally.  Strength is decreased due to pain.  Ultrasound right thumb: Patient has a normal appearing flexor pollicis tendon with mild thickening at level of MCP 1 There is a calcific nodule clearly visualized at volar surface of MCP 1 that impinges on the flexor pollicis tendon. Joint appears normal.  Impression: consistent with peritendinous nodule causing "trigger thumb"  Ultrasound and interpretation by Dr. Benjiman Core and Connor Lee. Lee, Connor Lee    Assessment & Plan:  1. Hand pain, right  - [place] LIMITED JOINT SPACE STRUCTURES UP RIGHT; Future  2. Trigger finger, acquired (Primary) -Discussed with patient regarding trigger finger injection.  Patient is agreeable and like to go ahead with injection.  Patient tolerated procedure well.   Advised patient to continue wearing the [facility] brace in the meantime after injection.  Patient advised to follow-up in 4 to 6 weeks and at that time can reevaluate if patient would be good for another injection.  PROCEDURE:  Risks & benefits of right thumb trigger finger injection reviewed.  Consent obtained.  Time-out completed.  Patient prepped and draped in the normal fashion.  Area cleansed with alcohol.  Ethyl chloride spray used to anesthetize the skin.  Solution of 0.5 mL 1% lidocaine with 0.5 mL methylprednisolone (Depo-Medrol) 40mg /ml injected into Thumb finger A1 pulley.  Patient tolerated procedure well without any complications.  Area covered with adhesive bandage.  Post-procedure care reviewed.  All questions answered.     Connor Lee Connor Lee, PGY-4  Sports Medicine Fellow [facility]  I observed and examined the patient with the [facility] resident and agree with assessment and plan.  Note reviewed and modified by me. Connor Lee, Connor Lee

## 2024-01-26 ENCOUNTER — Other Ambulatory Visit: Payer: BC Managed Care – PPO | Admitting: Sports Medicine

## 2024-02-02 ENCOUNTER — Ambulatory Visit (INDEPENDENT_AMBULATORY_CARE_PROVIDER_SITE_OTHER): Payer: BC Managed Care – PPO | Admitting: Sports Medicine

## 2024-02-02 VITALS — BP 112/68 | Ht 69.0 in | Wt 169.0 lb

## 2024-02-02 DIAGNOSIS — M5010 Cervical disc disorder with radiculopathy, unspecified cervical region: Secondary | ICD-10-CM

## 2024-02-02 DIAGNOSIS — M65311 Trigger thumb, right thumb: Secondary | ICD-10-CM | POA: Insufficient documentation

## 2024-02-02 NOTE — Progress Notes (Signed)
 Chief complaint: Follow-up of right trigger thumb and left trapezius spasm  Patient had an injection annually 28th for right trigger thumb.  Since that time it is significantly better but still does catch a bit.  He uses a thumb splint that stabilizes the MCP joint of the thumb and that is helpful.  No significant pain with this.  However he did use his left arm a lot more while he was out and now has some left trapezius spasm.  He has some known degenerative disc disease in his neck.  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  On flexion and extension it does pop a bit but does not lock at this time  His left trapezius is clearly in some spasm in his right is unremarkable Range of motion of his neck is only mildly limited And he has no pain with neck evaluation today

## 2024-02-02 NOTE — Assessment & Plan Note (Signed)
 No clear radiculopathy noted today but he is having significant left trapezius spasm I suspect this relates to some overuse but also to his degenerative disc disease in his cervical spine Given some exercises to work at his trapezium with shake outs and shrugs as well as abduction  I tried shockwave today to see if this helps speed his course Large head size Power 120 mJ Frequency 16 1500 impulses  Patient felt some relaxation of the spasm immediately afterwards If the spasm persists he may return for some additional shockwave as needed

## 2024-02-02 NOTE — Assessment & Plan Note (Signed)
 This is improving and not bothering him that much I suggested using topical Voltaren to massage into that area Continue his thumb splint when he is doing much work with his hand Give this another 4 to 6 weeks but we will reinjected if is still bothersome

## 2024-03-08 ENCOUNTER — Encounter: Payer: Self-pay | Admitting: Sports Medicine

## 2024-03-13 ENCOUNTER — Ambulatory Visit (INDEPENDENT_AMBULATORY_CARE_PROVIDER_SITE_OTHER): Payer: Self-pay | Admitting: Family Medicine

## 2024-03-13 ENCOUNTER — Encounter: Payer: Self-pay | Admitting: Family Medicine

## 2024-03-13 DIAGNOSIS — M5010 Cervical disc disorder with radiculopathy, unspecified cervical region: Secondary | ICD-10-CM

## 2024-03-13 NOTE — Progress Notes (Signed)
 FOLLOW-UP VISIT:  Shockwave Therapy Procedure   NAME:  Connor Lee DOB:  [birth year] MR#: [id number] DATE OF VISIT:  03/13/2024  Encounter:   Connor Lee comes in for a follow up evaluation and 2nd Radial Extracorporeal Shockwave therapy (ESWT) treatment to the  Lt trapezius area.  Connor Lee reports some improvement with prior treatment on 02/02/24 Still having some ongoing pain from the left neck radiating into the left trapezius He reach out earlier today Dr. Nolene Baumgarten recommended he return for repeat shockwave therapy   ESWT Procedure Note: Treatment #2  Diagnosis: Left trapezius pain likely secondary to underlying cervical DDD and radiculopathy  Procedure Details  Consent for the procedure was obtained. Time out was performed. The anatomical landmarks and target areas for the procedure were identified.   PROCEDURE: ESWT was discussed with the patient in detail.  The risk and benefits were explained.  The point of maximal tenderness was identified and the oscillator probe was applied with moderate pressure. Treatment adjusted as mediated by patient feedback/pain.  Left trapezius and left neck was targeted for ESWT  Preset: Post muscle injury Power level: 120 MJ Frequency: 60 hz Impulses: 2500 Head size: Large   Complications:  None; patient tolerated the procedure well.  1. Cervical disc disorder with radiculopathy of cervical region Ongoing left-sided neck and trapezius pain with still some underlying spasm.  Did improve somewhat with prior shockwave therapy 02/02/2024.  Symptoms likely related to overuse as well as underlying degenerative disc disease in the cervical spine with some radiculopathy - Candidate for repeat shockwave therapy today  Plan:   ESWT completed today as noted above Return in 1-2 week for ESWT procedure Procedure aftercare reviewed Recommended avoiding strenuous activities and using OTC analgesia prn.

## 2024-03-13 NOTE — Patient Instructions (Signed)

## 2024-04-24 ENCOUNTER — Ambulatory Visit (INDEPENDENT_AMBULATORY_CARE_PROVIDER_SITE_OTHER): Payer: Self-pay | Admitting: Family Medicine

## 2024-04-24 ENCOUNTER — Encounter: Payer: Self-pay | Admitting: Family Medicine

## 2024-04-24 DIAGNOSIS — M5010 Cervical disc disorder with radiculopathy, unspecified cervical region: Secondary | ICD-10-CM

## 2024-04-24 NOTE — Patient Instructions (Signed)

## 2024-04-24 NOTE — Progress Notes (Signed)
 FOLLOW-UP VISIT:  Shockwave Therapy Procedure   NAME:  Koda Routon DOB:  Apr 13, 1956 MR#: [id number] DATE OF VISIT:  04/24/2024  Encounter:   Autry Legions comes in for a follow up evaluation and 3rd Radial Extracorporeal Shockwave therapy (ESWT) treatment to the  Lt trapezius & left neck.  Last treatment 03/13/2024 with good response Has started to experience some increasing symptoms along the left trapezius Has been using Motrin as needed  ESWT Procedure Note: Treatment #3  Diagnosis: Left trapezius pain likely secondary to underlying cervical DDD and radiculopathy   Procedure Details  Consent for the procedure was obtained. Time out was performed. The anatomical landmarks and target areas for the procedure were identified.   PROCEDURE: ESWT was discussed with the patient in detail.  The risk and benefits were explained.  The point of maximal tenderness was identified and the oscillator probe was applied with moderate pressure. Treatment adjusted as mediated by patient feedback/pain.  Left trapezius and left neck was targeted for ESWT  Preset: Post Muscle injury Power level: 120 MJ Frequency: 60 Hz Impulses: 3000 Head size: large   Complications:  None; patient tolerated the procedure well.  ASSESSMENT: 1. Cervical disc disorder with radiculopathy of cervical region  Plan:   ESWT completed today as noted above Return in 1-2 week for ESWT procedure Procedure aftercare reviewed Recommended avoiding strenuous activities and using OTC analgesia prn.

## 2024-05-14 ENCOUNTER — Other Ambulatory Visit: Payer: Self-pay

## 2024-05-14 ENCOUNTER — Encounter: Payer: Self-pay | Admitting: Family Medicine

## 2024-05-14 ENCOUNTER — Ambulatory Visit (INDEPENDENT_AMBULATORY_CARE_PROVIDER_SITE_OTHER): Admitting: Family Medicine

## 2024-05-14 VITALS — BP 122/60 | Ht 69.0 in | Wt 169.0 lb

## 2024-05-14 DIAGNOSIS — K219 Gastro-esophageal reflux disease without esophagitis: Secondary | ICD-10-CM | POA: Diagnosis not present

## 2024-05-14 DIAGNOSIS — M65311 Trigger thumb, right thumb: Secondary | ICD-10-CM

## 2024-05-14 DIAGNOSIS — J4540 Moderate persistent asthma, uncomplicated: Secondary | ICD-10-CM | POA: Diagnosis not present

## 2024-05-14 DIAGNOSIS — J3089 Other allergic rhinitis: Secondary | ICD-10-CM | POA: Diagnosis not present

## 2024-05-14 DIAGNOSIS — Z5181 Encounter for therapeutic drug level monitoring: Secondary | ICD-10-CM | POA: Diagnosis not present

## 2024-05-14 DIAGNOSIS — Z1321 Encounter for screening for nutritional disorder: Secondary | ICD-10-CM | POA: Diagnosis not present

## 2024-05-14 MED ORDER — METHYLPREDNISOLONE ACETATE 40 MG/ML IJ SUSP
20.0000 mg | Freq: Once | INTRAMUSCULAR | Status: AC
  Administered 2024-05-14: 20 mg via INTRA_ARTICULAR

## 2024-05-14 NOTE — Patient Instructions (Signed)

## 2024-05-14 NOTE — Assessment & Plan Note (Signed)
 Ongoing right trigger thumb, status post previous ultrasound-guided trigger thumb injection 12/27/2023.  Had initial improvement, but symptoms worsened.  Plan: - MSK ultrasound again today showing nodule at the A1 pulley.  Does appear somewhat larger than last evaluation in January 2025.  He would benefit from repeat trigger thumb injection.  This was completed today as noted below.  PROCEDURE:  Risks & benefits of right thumb trigger finger injection with ultrasound guidance reviewed.  Consent obtained.  Time-out completed.  MSK ultrasound used to identify the A1 pulley of the right thumb.  Noted to have visible thickening and nodule at the A1 pulley.  Normal-appearing flexor pollicis tendon at the first MCP.  Normal-appearing first MCP.  Patient prepped and draped in the normal fashion.  Area cleansed with chlorhexidine.  Ethyl chloride spray used to anesthetize the skin.  Ultrasound probe with sterile gel was reapplied.  Solution of 0.5 mL 1% lidocaine with 0.5 mL methylprednisolone  (Depo-Medrol ) 40mg /ml injected into right thumb A1 pulley. under ultrasound guidance.  Needle placement confirmed under the A1 pulley. Patient tolerated procedure well without any complications.  Area covered with adhesive bandage.  Post-procedure care reviewed.  All questions answered.  -Patient advised to continue wearing his CMC brace at bedtime over the next several weeks.  Can also wear during the day as needed. - If no improvement over the next 4 to 6 weeks, recommend referral to Hand Surgery for consideration of trigger finger release

## 2024-05-14 NOTE — Progress Notes (Signed)
 DATE OF VISIT: 05/14/2024        Connor Lee DOB: 18-Jan-1956 MRN: [id number]  CC:  Rt trigger thumb  History of present Illness: Connor Lee is a 68 y.o. male who presents for a follow-up visit for Rt Trigger thumb Previous injection with us  12/27/23 Continues to have ongoing issues & triggering Feels like it has been getting worse Would like repeat injection  Medications:  Outpatient Encounter Medications as of 05/14/2024  Medication Sig   ATRALIN 0.05 % gel    cholecalciferol (VITAMIN D) 400 UNITS TABS tablet Take 400 Units by mouth daily.   ciclesonide (ALVESCO) 160 MCG/ACT inhaler Inhale 2 puffs into the lungs 2 (two) times daily.    ciclopirox (LOPROX) 0.77 % SUSP APPLY 10-12 DROPS TO SCAPL DAILY AS NEEDED FOR FLARES   clindamycin (CLEOCIN T) 1 % external solution APPLY TO POSTERIOR SCALP DAILY   cyclobenzaprine  (FLEXERIL ) 10 MG tablet Take 1 tablet (10 mg total) by mouth at bedtime as needed for muscle spasms.   doxycycline (VIBRAMYCIN) 100 MG capsule Take 100 mg by mouth 2 (two) times daily.    lansoprazole (PREVACID) 30 MG capsule Take 15 mg by mouth daily.    Loratadine (CLARITIN PO) Take by mouth every morning.     metroNIDAZOLE (METROGEL) 0.75 % gel Apply 1 application topically 2 (two) times daily as needed.    montelukast (SINGULAIR) 10 MG tablet Take 10 mg by mouth every morning.     mupirocin ointment (BACTROBAN) 2 % APPLY SPARINGLY TO AFFECTED AREA(S) TWICE DAILY   penciclovir (DENAVIR) 1 % cream Apply 1 application topically as needed.     PROAIR HFA 108 (90 BASE) MCG/ACT inhaler Inhale 2 puffs into the lungs as needed.   Spacer/Aero-Holding Chambers (EASIVENT) inhaler USE FOR INHALER   [EXPIRED] methylPREDNISolone  acetate (DEPO-MEDROL ) injection 20 mg    No facility-administered encounter medications on file as of 05/14/2024.    Allergies: is allergic to penicillins.  Physical Examination: Vitals: BP 122/60 (BP Location: Left Arm, Patient Position:  Sitting, Cuff Size: Normal)   Ht 5' 9 (1.753 m)   Wt 169 lb (76.7 kg)   BMI 24.96 kg/m  GENERAL:  Connor Lee is a 68 y.o. male appearing their stated age, alert and oriented x 3, in no apparent distress.  SKIN: no rashes or lesions, skin clean, dry, intact MSK: Hand: Palpable nodule at A1 pulley of the right thumb.  Palpable triggering is noted.  No other nodules or triggering noted on the rest of the hand.  Normal grip strength. Neurovascular intact distally  Assessment & Plan Trigger thumb of right hand Ongoing right trigger thumb, status post previous ultrasound-guided trigger thumb injection 12/27/2023.  Had initial improvement, but symptoms worsened.  Plan: - MSK ultrasound again today showing nodule at the A1 pulley.  Does appear somewhat larger than last evaluation in January 2025.  He would benefit from repeat trigger thumb injection.  This was completed today as noted below.  PROCEDURE:  Risks & benefits of right thumb trigger finger injection with ultrasound guidance reviewed.  Consent obtained.  Time-out completed.  MSK ultrasound used to identify the A1 pulley of the right thumb.  Noted to have visible thickening and nodule at the A1 pulley.  Normal-appearing flexor pollicis tendon at the first MCP.  Normal-appearing first MCP.  Patient prepped and draped in the normal fashion.  Area cleansed with chlorhexidine.  Ethyl chloride spray used to anesthetize the skin.  Ultrasound probe with sterile gel  was reapplied.  Solution of 0.5 mL 1% lidocaine with 0.5 mL methylprednisolone  (Depo-Medrol ) 40mg /ml injected into right thumb A1 pulley. under ultrasound guidance.  Needle placement confirmed under the A1 pulley. Patient tolerated procedure well without any complications.  Area covered with adhesive bandage.  Post-procedure care reviewed.  All questions answered.  -Patient advised to continue wearing his CMC brace at bedtime over the next several weeks.  Can also wear during the day  as needed. - If no improvement over the next 4 to 6 weeks, recommend referral to Hand Surgery for consideration of trigger finger release   Patient expressed understanding & agreement with above.  Encounter Diagnosis  Name Primary?   Trigger thumb of right hand Yes    Orders Placed This Encounter  Procedures   US  LIMITED JOINT SPACE STRUCTURES UP RIGHT

## 2024-07-31 ENCOUNTER — Encounter: Payer: Self-pay | Admitting: Sports Medicine

## 2024-08-03 ENCOUNTER — Other Ambulatory Visit: Payer: Self-pay | Admitting: *Deleted

## 2024-08-03 DIAGNOSIS — M25531 Pain in right wrist: Secondary | ICD-10-CM

## 2024-08-03 DIAGNOSIS — M65311 Trigger thumb, right thumb: Secondary | ICD-10-CM

## 2024-08-15 ENCOUNTER — Encounter: Payer: Self-pay | Admitting: Sports Medicine

## 2024-10-16 ENCOUNTER — Encounter: Payer: Self-pay | Admitting: Sports Medicine
